# Patient Record
Sex: Male | Born: 1975 | Race: Black or African American | Hispanic: No | Marital: Single | Smoking: Current every day smoker
Health system: Southern US, Community
[De-identification: ages and names within clinical notes are randomized; demographics above are authoritative.]

---

## 1997-10-23 ENCOUNTER — Emergency Department (HOSPITAL_COMMUNITY): Admission: EM | Admit: 1997-10-23 | Discharge: 1997-10-23 | Payer: Self-pay | Admitting: *Deleted

## 2007-01-11 ENCOUNTER — Emergency Department (HOSPITAL_COMMUNITY): Admission: EM | Admit: 2007-01-11 | Discharge: 2007-01-11 | Payer: Self-pay | Admitting: Emergency Medicine

## 2008-07-16 ENCOUNTER — Emergency Department (HOSPITAL_COMMUNITY): Admission: EM | Admit: 2008-07-16 | Discharge: 2008-07-16 | Payer: Self-pay | Admitting: Emergency Medicine

## 2008-10-15 ENCOUNTER — Emergency Department (HOSPITAL_COMMUNITY): Admission: EM | Admit: 2008-10-15 | Discharge: 2008-10-15 | Payer: Self-pay | Admitting: Family Medicine

## 2009-08-12 ENCOUNTER — Emergency Department (HOSPITAL_COMMUNITY): Admission: EM | Admit: 2009-08-12 | Discharge: 2009-08-12 | Payer: Self-pay | Admitting: Family Medicine

## 2010-03-18 ENCOUNTER — Emergency Department (HOSPITAL_COMMUNITY)
Admission: EM | Admit: 2010-03-18 | Discharge: 2010-03-18 | Disposition: A | Discharge status: Home / Self Care | Payer: Self-pay | Attending: Emergency Medicine | Admitting: Emergency Medicine

## 2010-03-18 DIAGNOSIS — L03317 Cellulitis of buttock: Secondary | ICD-10-CM | POA: Insufficient documentation

## 2010-03-18 DIAGNOSIS — L0231 Cutaneous abscess of buttock: Secondary | ICD-10-CM | POA: Insufficient documentation

## 2010-04-24 LAB — CULTURE, ROUTINE-ABSCESS

## 2010-05-16 LAB — CULTURE, ROUTINE-ABSCESS

## 2010-10-05 ENCOUNTER — Emergency Department (HOSPITAL_COMMUNITY)
Admission: EM | Admit: 2010-10-05 | Discharge: 2010-10-06 | Disposition: A | Discharge status: Home / Self Care | Payer: Self-pay | Attending: Emergency Medicine | Admitting: Emergency Medicine

## 2010-10-05 ENCOUNTER — Emergency Department (HOSPITAL_COMMUNITY)
Admission: EM | Admit: 2010-10-05 | Discharge: 2010-10-05 | Discharge status: Against Medical Advice | Payer: Self-pay | Attending: Emergency Medicine | Admitting: Emergency Medicine

## 2010-10-05 DIAGNOSIS — IMO0001 Reserved for inherently not codable concepts without codable children: Secondary | ICD-10-CM | POA: Insufficient documentation

## 2010-10-05 DIAGNOSIS — L0501 Pilonidal cyst with abscess: Secondary | ICD-10-CM | POA: Insufficient documentation

## 2014-01-16 ENCOUNTER — Emergency Department (HOSPITAL_COMMUNITY)
Admission: EM | Admit: 2014-01-16 | Discharge: 2014-01-16 | Disposition: A | Discharge status: Home / Self Care | Payer: Self-pay | Attending: Emergency Medicine | Admitting: Emergency Medicine

## 2014-01-16 ENCOUNTER — Encounter (HOSPITAL_COMMUNITY): Payer: Self-pay | Admitting: Emergency Medicine

## 2014-01-16 DIAGNOSIS — R51 Headache: Secondary | ICD-10-CM | POA: Insufficient documentation

## 2014-01-16 DIAGNOSIS — Z720 Tobacco use: Secondary | ICD-10-CM | POA: Insufficient documentation

## 2014-01-16 DIAGNOSIS — I10 Essential (primary) hypertension: Secondary | ICD-10-CM | POA: Insufficient documentation

## 2014-01-16 LAB — CBC WITH DIFFERENTIAL/PLATELET
BASOS ABS: 0.1 10*3/uL (ref 0.0–0.1)
Basophils Relative: 1 % (ref 0–1)
Eosinophils Absolute: 0.1 10*3/uL (ref 0.0–0.7)
Eosinophils Relative: 2 % (ref 0–5)
HEMATOCRIT: 41.4 % (ref 39.0–52.0)
HEMOGLOBIN: 14.7 g/dL (ref 13.0–17.0)
LYMPHS ABS: 3.4 10*3/uL (ref 0.7–4.0)
LYMPHS PCT: 40 % (ref 12–46)
MCH: 31.3 pg (ref 26.0–34.0)
MCHC: 35.5 g/dL (ref 30.0–36.0)
MCV: 88.3 fL (ref 78.0–100.0)
MONO ABS: 0.7 10*3/uL (ref 0.1–1.0)
MONOS PCT: 8 % (ref 3–12)
NEUTROS ABS: 4.2 10*3/uL (ref 1.7–7.7)
Neutrophils Relative %: 49 % (ref 43–77)
Platelets: 221 10*3/uL (ref 150–400)
RBC: 4.69 MIL/uL (ref 4.22–5.81)
RDW: 12.8 % (ref 11.5–15.5)
WBC: 8.5 10*3/uL (ref 4.0–10.5)

## 2014-01-16 LAB — COMPREHENSIVE METABOLIC PANEL
ALT: 19 U/L (ref 0–53)
AST: 21 U/L (ref 0–37)
Albumin: 4.2 g/dL (ref 3.5–5.2)
Alkaline Phosphatase: 71 U/L (ref 39–117)
Anion gap: 15 (ref 5–15)
BILIRUBIN TOTAL: 0.6 mg/dL (ref 0.3–1.2)
BUN: 13 mg/dL (ref 6–23)
CHLORIDE: 101 meq/L (ref 96–112)
CO2: 24 meq/L (ref 19–32)
CREATININE: 1.14 mg/dL (ref 0.50–1.35)
Calcium: 9.8 mg/dL (ref 8.4–10.5)
GFR calc Af Amer: >90 mL/min (ref >90)
GFR, EST NON AFRICAN AMERICAN: 79 mL/min — AB (ref >90)
GLUCOSE: 95 mg/dL (ref 70–99)
Potassium: 4 mEq/L (ref 3.7–5.3)
Sodium: 140 mEq/L (ref 137–147)
Total Protein: 7.4 g/dL (ref 6.0–8.3)

## 2014-01-16 MED ORDER — IBUPROFEN 800 MG PO TABS: 800.0000 mg | ORAL_TABLET | Freq: Three times a day (TID) | ORAL | Status: DC

## 2014-01-16 MED ORDER — HYDROCHLOROTHIAZIDE 25 MG PO TABS: 25.0000 mg | ORAL_TABLET | Freq: Every day | ORAL | Status: DC

## 2014-01-16 NOTE — ED Notes (Signed)
Pt asking for something to eat.  PA made aware.

## 2014-01-16 NOTE — ED Notes (Signed)
Patient states he has had a headache that has been ongoing x 2 weeks.  Patient had BP checked last night by cousin and the BP was 168/92.

## 2014-01-16 NOTE — Discharge Instructions (Signed)
Hypertension °Hypertension, commonly called high blood pressure, is when the force of blood pumping through your arteries is too strong. Your arteries are the blood vessels that carry blood from your heart throughout your body. A blood pressure reading consists of a higher number over a lower number, such as 110/72. The higher number (systolic) is the pressure inside your arteries when your heart pumps. The lower number (diastolic) is the pressure inside your arteries when your heart relaxes. Ideally you want your blood pressure below 120/80. °Hypertension forces your heart to work harder to pump blood. Your arteries may become narrow or stiff. Having hypertension puts you at risk for heart disease, stroke, and other problems.  °RISK FACTORS °Some risk factors for high blood pressure are controllable. Others are not.  °Risk factors you cannot control include:  °· Race. You may be at higher risk if you are African American. °· Age. Risk increases with age. °· Gender. Men are at higher risk than women before age 45 years. After age 65, women are at higher risk than men. °Risk factors you can control include: °· Not getting enough exercise or physical activity. °· Being overweight. °· Getting too much fat, sugar, calories, or salt in your diet. °· Drinking too much alcohol. °SIGNS AND SYMPTOMS °Hypertension does not usually cause signs or symptoms. Extremely high blood pressure (hypertensive crisis) may cause headache, anxiety, shortness of breath, and nosebleed. °DIAGNOSIS  °To check if you have hypertension, your health care provider will measure your blood pressure while you are seated, with your arm held at the level of your heart. It should be measured at least twice using the same arm. Certain conditions can cause a difference in blood pressure between your right and left arms. A blood pressure reading that is higher than normal on one occasion does not mean that you need treatment. If one blood pressure reading  is high, ask your health care provider about having it checked again. °TREATMENT  °Treating high blood pressure includes making lifestyle changes and possibly taking medicine. Living a healthy lifestyle can help lower high blood pressure. You may need to change some of your habits. °Lifestyle changes may include: °· Following the DASH diet. This diet is high in fruits, vegetables, and whole grains. It is low in salt, red meat, and added sugars. °· Getting at least 2½ hours of brisk physical activity every week. °· Losing weight if necessary. °· Not smoking. °· Limiting alcoholic beverages. °· Learning ways to reduce stress. ° If lifestyle changes are not enough to get your blood pressure under control, your health care provider may prescribe medicine. You may need to take more than one. Work closely with your health care provider to understand the risks and benefits. °HOME CARE INSTRUCTIONS °· Have your blood pressure rechecked as directed by your health care provider.   °· Take medicines only as directed by your health care provider. Follow the directions carefully. Blood pressure medicines must be taken as prescribed. The medicine does not work as well when you skip doses. Skipping doses also puts you at risk for problems.   °· Do not smoke.   °· Monitor your blood pressure at home as directed by your health care provider.  °SEEK MEDICAL CARE IF:  °· You think you are having a reaction to medicines taken. °· You have recurrent headaches or feel dizzy. °· You have swelling in your ankles. °· You have trouble with your vision. °SEEK IMMEDIATE MEDICAL CARE IF: °· You develop a severe headache or confusion. °·   You have unusual weakness, numbness, or feel faint.  You have severe chest or abdominal pain.  You vomit repeatedly.  You have trouble breathing. MAKE SURE YOU:   Understand these instructions.  Will watch your condition.  Will get help right away if you are not doing well or get worse. Document  Released: 01/23/2005 Document Revised: 06/09/2013 Document Reviewed: 11/15/2012 [facility] Patient Information 2015 [place], [place]. This information is not intended to replace advice given to you by your health care provider. Make sure you discuss any questions you have with your health care provider.  Heart Disease Prevention Heart disease can lead to heart attacks and strokes. This is a leading cause of death. Heart disease can be inherited and can be caused from the lifestyle you lead. You can do a lot to keep your heart and blood vessels healthy.  WHAT SHOULD I DO EACH DAY TO KEEP MY HEART HEALTHY?  Do not smoke.  Follow a healthy eating plan as recommended by your caregiver or dietitian.  Be active for a total of 30 minutes most days. Ask your caregiver what activities are best for you.  Limit the amount of alcohol you drink.  Involve family and friends to help you with a healthy lifestyle. HOW DOES HEART DISEASE CAUSE HIGH BLOOD PRESSURE?  Narrowed blood vessels leave a smaller opening for blood to flow through. It is like turning on a garden hose and holding your thumb over the opening. The smaller opening makes the water shoot out with more pressure. In the same way, narrowed blood vessels can lead to high blood pressure. Other factors, such as kidney problems and being overweight, also can lead to high blood pressure.  If you have high blood pressure you may need to take blood pressure medicine every day. Some types of blood pressure medicine can also help keep your kidneys healthy.  Many people with diabetes also have high blood pressure. If you have heart, eye, or kidney problems from diabetes, high blood pressure can make them worse. HOW DO MY BLOOD VESSELS GET CLOGGED?  Cholesterol is a substance that is made by the body and used for many important functions. It is also found in food that comes from animals. When your cholesterol is high, it can stick to the insides of your blood  vessels, making them narrowed and even clogged. This problem is called atherosclerosis.  Narrowed and clogged blood vessels make it harder for blood to get to important body organs. This can cause problems such as:  Chest pain (angina). Angina can cause temporary pain in your chest, arms, shoulders, or back. You may feel the pain more when your heart beats faster, such as when you exercise. The pain may go away when you rest. You also may feel very weak and sweaty.  A heart attack. A heart attack happens when a blood vessel in or near the heart becomes blocked. Not enough blood is getting to the heart. During a heart attack, you may have chest pain in your chest, arms, shoulders, or back along with nausea, indigestion, extreme weakness, and sweating. WHAT CAN I DO TO PREVENT HEART DISEASE?   Keep your blood pressure under control as recommended by your caregiver.  Keep your cholesterol under control. Have it checked at least once a year. Target cholesterol levels for most people are:  Total blood cholesterol level: Below 200.  LDL (bad) cholesterol: Below 100.  HDL (good) cholesterol: Above 40 in men and above 50 in women.  Triglycerides (  another type of fat in the blood): Below 150.  Make physical activity a part of your daily routine. Check with your caregiver to learn what activities are best for you.  Make sure that the foods you eat are "heart-healthy."  Include foods high in fiber, such as oat bran, oatmeal, whole-grain breads and cereals.  Cut back on fried foods and foods high in saturated fat. This includes foods such as meats, butter, whole dairy products, shortening, and coconut or palm oil.  Avoid salty foods such as canned food, luncheon meat, salty snacks, and fast food.  Eat more fruits and vegetables.  Drink less alcohol.  Lose weight as recommended by your caregiver.  If you smoke, quit. Your caregiver can help you with quitting options.  Ask your caregiver  whether you should take a daily aspirin. Studies have shown that taking aspirin can help reduce your risk of heart disease and stroke.  Take your prescribed medicines as directed. WHAT ARE THE WARNING SIGNS OF A HEART ATTACK? You may have one or more of the following warning signs:  Chest pain or discomfort.  Pain or discomfort in your arms, back, jaw, or neck.  Indigestion or stomach pain.  Shortness of breath.  Sweating.  Nausea or vomiting.  Lightheadedness.  No warning signs at all or they may come and go. FOR MORE INFORMATION  To find out more about heart disease and stroke prevention, visit the American Heart Association website at www.americanheart.org Document Released: 09/07/2003 Document Revised: 07/25/2011 Document Reviewed: 03/19/2013 [facility] Patient Information 2015 [place], [place]. This information is not intended to replace advice given to you by your health care provider. Make sure you discuss any questions you have with your health care provider.  DASH Eating Plan DASH stands for "Dietary Approaches to Stop Hypertension." The DASH eating plan is a healthy eating plan that has been shown to reduce high blood pressure (hypertension). Additional health benefits may include reducing the risk of type 2 diabetes mellitus, heart disease, and stroke. The DASH eating plan may also help with weight loss. WHAT DO I NEED TO KNOW ABOUT THE DASH EATING PLAN? For the DASH eating plan, you will follow these general guidelines:  Choose foods with a percent daily value for sodium of less than 5% (as listed on the food label).  Use salt-free seasonings or herbs instead of table salt or sea salt.  Check with your health care provider or pharmacist before using salt substitutes.  Eat lower-sodium products, often labeled as "lower sodium" or "no salt added."  Eat fresh foods.  Eat more vegetables, fruits, and low-fat dairy products.  Choose whole grains. Look for the word  "whole" as the first word in the ingredient list.  Choose fish and skinless chicken or [place] more often than red meat. Limit fish, poultry, and meat to 6 oz (170 g) each day.  Limit sweets, desserts, sugars, and sugary drinks.  Choose heart-healthy fats.  Limit cheese to 1 oz (28 g) per day.  Eat more home-cooked food and less restaurant, buffet, and fast food.  Limit fried foods.  Cook foods using methods other than frying.  Limit canned vegetables. If you do use them, rinse them well to decrease the sodium.  When eating at a restaurant, ask that your food be prepared with less salt, or no salt if possible. WHAT FOODS CAN I EAT? Seek help from a dietitian for individual calorie needs. Grains Whole grain or whole wheat bread. Brown rice. Whole grain or whole  wheat pasta. Quinoa, bulgur, and whole grain cereals. Low-sodium cereals. Corn or whole wheat flour tortillas. Whole grain cornbread. Whole grain crackers. Low-sodium crackers. Vegetables Fresh or frozen vegetables (raw, steamed, roasted, or grilled). Low-sodium or reduced-sodium tomato and vegetable juices. Low-sodium or reduced-sodium tomato sauce and paste. Low-sodium or reduced-sodium canned vegetables.  Fruits All fresh, canned (in natural juice), or frozen fruits. Meat and Other Protein Products Ground beef (85% or leaner), grass-fed beef, or beef trimmed of fat. Skinless chicken or [place]. Ground chicken or [place]. Pork trimmed of fat. All fish and seafood. Eggs. Dried beans, peas, or lentils. Unsalted nuts and seeds. Unsalted canned beans. Dairy Low-fat dairy products, such as skim or 1% milk, 2% or reduced-fat cheeses, low-fat ricotta or cottage cheese, or plain low-fat yogurt. Low-sodium or reduced-sodium cheeses. Fats and Oils Tub margarines without trans fats. Light or reduced-fat mayonnaise and salad dressings (reduced sodium). Avocado. Safflower, olive, or canola oils. Natural peanut or almond  butter. Other Unsalted popcorn and pretzels. The items listed above may not be a complete list of recommended foods or beverages. Contact your dietitian for more options. WHAT FOODS ARE NOT RECOMMENDED? Grains White bread. White pasta. White rice. Refined cornbread. Bagels and croissants. Crackers that contain trans fat. Vegetables Creamed or fried vegetables. Vegetables in a cheese sauce. Regular canned vegetables. Regular canned tomato sauce and paste. Regular tomato and vegetable juices. Fruits Dried fruits. Canned fruit in light or heavy syrup. Fruit juice. Meat and Other Protein Products Fatty cuts of meat. Ribs, chicken wings, bacon, sausage, bologna, salami, chitterlings, fatback, hot dogs, bratwurst, and packaged luncheon meats. Salted nuts and seeds. Canned beans with salt. Dairy Whole or 2% milk, cream, half-and-half, and cream cheese. Whole-fat or sweetened yogurt. Full-fat cheeses or blue cheese. Nondairy creamers and whipped toppings. Processed cheese, cheese spreads, or cheese curds. Condiments Onion and garlic salt, seasoned salt, table salt, and sea salt. Canned and packaged gravies. Worcestershire sauce. Tartar sauce. Barbecue sauce. Teriyaki sauce. Soy sauce, including reduced sodium. Steak sauce. Fish sauce. Oyster sauce. Cocktail sauce. Horseradish. Ketchup and mustard. Meat flavorings and tenderizers. Bouillon cubes. Hot sauce. Tabasco sauce. Marinades. Taco seasonings. Relishes. Fats and Oils Butter, stick margarine, lard, shortening, ghee, and bacon fat. Coconut, palm kernel, or palm oils. Regular salad dressings. Other Pickles and olives. Salted popcorn and pretzels. The items listed above may not be a complete list of foods and beverages to avoid. Contact your dietitian for more information. WHERE CAN I FIND MORE INFORMATION? National Heart, Lung, and Blood Institute: [employer].it Document Released: 01/12/2011 Document Revised:  06/09/2013 Document Reviewed: 11/27/2012 [facility] Patient Information 2015 [place], [place]. This information is not intended to replace advice given to you by your health care provider. Make sure you discuss any questions you have with your health care provider. General Headache Without Cause A general headache is pain or discomfort felt around the head or neck area. The cause may not be found.  HOME CARE   Keep all doctor visits.  Only take medicines as told by your doctor.  Lie down in a dark, quiet room when you have a headache.  Keep a journal to find out if certain things bring on headaches. For example, write down:  What you eat and drink.  How much sleep you get.  Any change to your diet or medicines.  Relax by getting a massage or doing other relaxing activities.  Put ice or heat packs on the head and neck area as told by your doctor.  Lessen  stress.  Sit up straight. Do not tighten (tense) your muscles.  Quit smoking if you smoke.  Lessen how much alcohol you drink.  Lessen how much caffeine you drink, or stop drinking caffeine.  Eat and sleep on a regular schedule.  Get 7 to 9 hours of sleep, or as told by your doctor.  Keep lights dim if bright lights bother you or make your headaches worse. GET HELP RIGHT AWAY IF:   Your headache becomes really bad.  You have a fever.  You have a stiff neck.  You have trouble seeing.  Your muscles are weak, or you lose muscle control.  You lose your balance or have trouble walking.  You feel like you will pass out (faint), or you pass out.  You have really bad symptoms that are different than your first symptoms.  You have problems with the medicines given to you by your doctor.  Your medicines do not work.  Your headache feels different than the other headaches.  You feel sick to your stomach (nauseous) or throw up (vomit). MAKE SURE YOU:   Understand these instructions.  Will watch your  condition.  Will get help right away if you are not doing well or get worse. Document Released: 11/02/2007 Document Revised: 04/17/2011 Document Reviewed: 01/13/2011 [facility] Patient Information 2015 [place], [place]. This information is not intended to replace advice given to you by your health care provider. Make sure you discuss any questions you have with your health care provider.

## 2014-01-16 NOTE — ED Provider Notes (Signed)
CSN: [id number]     Arrival date & time 01/16/14  0802 History   First MD Initiated Contact with Patient 01/16/14 0815     Chief Complaint  Patient presents with  . Hypertension  . Headache     (Consider location/radiation/quality/duration/timing/severity/associated sxs/prior Treatment) Patient is a 38 y.o. male presenting with hypertension and headaches. The history is provided by the patient. No language interpreter was used.  Hypertension This is a new problem. The current episode started 1 to 4 weeks ago. The problem occurs constantly. The problem has been unchanged. Associated symptoms include headaches and a rash. Nothing aggravates the symptoms. He has tried nothing for the symptoms. The treatment provided no relief.  Headache Pt reports he has had a headache for the past 2 weeks.  Pt reports he had a friend take his blood pressure last pm and it was elevated.  Pt reports increased stress.  Pt does not currently have a headache  History reviewed. No pertinent past medical history. History reviewed. No pertinent past surgical history. No family history on file. History  Substance Use Topics  . Smoking status: Current Every Day Smoker  . Smokeless tobacco: Not on file  . Alcohol Use: Yes     Comment: Daily, 2 glasses of brandy    Review of Systems  Skin: Positive for rash.  Neurological: Positive for headaches.  All other systems reviewed and are negative.     Allergies  Bee venom  Home Medications   Prior to Admission medications   Not on File   BP 166/94 mmHg  Pulse 75  Temp(Src) 98.1 F (36.7 C) (Oral)  Resp 12  SpO2 99% Physical Exam  Constitutional: He appears well-developed and well-nourished.  HENT:  Head: Normocephalic.  Right Ear: External ear normal.  Left Ear: External ear normal.  Nose: Nose normal.  Mouth/Throat: Oropharynx is clear and moist.  Eyes: Conjunctivae are normal. Pupils are equal, round, and reactive to light.  Neck: Normal  range of motion. Neck supple.  Cardiovascular: Normal rate and normal heart sounds.   Pulmonary/Chest: Effort normal.  Abdominal: Soft.  Musculoskeletal: Normal range of motion.  Neurological: He is alert.  Skin: Skin is warm.  Psychiatric: He has a normal mood and affect.  Nursing note and vitals reviewed.   ED Course  Procedures (including critical care time) Labs Review Labs Reviewed  CBC WITH DIFFERENTIAL  COMPREHENSIVE METABOLIC PANEL   Results for orders placed or performed during the hospital encounter of 01/16/14  CBC with Differential  Result Value Ref Range   WBC 8.5 4.0 - 10.5 K/uL   RBC 4.69 4.22 - 5.81 MIL/uL   Hemoglobin 14.7 13.0 - 17.0 g/dL   HCT 08.641.4 57.839.0 - 46.952.0 %   MCV 88.3 78.0 - 100.0 fL   MCH 31.3 26.0 - 34.0 pg   MCHC 35.5 30.0 - 36.0 g/dL   RDW 62.912.8 52.811.5 - 41.315.5 %   Platelets 221 150 - 400 K/uL   Neutrophils Relative % 49 43 - 77 %   Neutro Abs 4.2 1.7 - 7.7 K/uL   Lymphocytes Relative 40 12 - 46 %   Lymphs Abs 3.4 0.7 - 4.0 K/uL   Monocytes Relative 8 3 - 12 %   Monocytes Absolute 0.7 0.1 - 1.0 K/uL   Eosinophils Relative 2 0 - 5 %   Eosinophils Absolute 0.1 0.0 - 0.7 K/uL   Basophils Relative 1 0 - 1 %   Basophils Absolute 0.1 0.0 - 0.1 K/uL  Comprehensive metabolic panel  Result Value Ref Range   Sodium 140 137 - 147 mEq/L   Potassium 4.0 3.7 - 5.3 mEq/L   Chloride 101 96 - 112 mEq/L   CO2 24 19 - 32 mEq/L   Glucose, Bld 95 70 - 99 mg/dL   BUN 13 6 - 23 mg/dL   Creatinine, Ser 1.611.14 0.50 - 1.35 mg/dL   Calcium 9.8 8.4 - 09.610.5 mg/dL   Total Protein 7.4 6.0 - 8.3 g/dL   Albumin 4.2 3.5 - 5.2 g/dL   AST 21 0 - 37 U/L   ALT 19 0 - 53 U/L   Alkaline Phosphatase 71 39 - 117 U/L   Total Bilirubin 0.6 0.3 - 1.2 mg/dL   GFR calc non Af Amer 79 (L) >90 mL/min   GFR calc Af Amer >90 >90 mL/min   Anion gap 15 5 - 15   No results found.  Imaging Review No results found.   EKG Interpretation None      MDM   Final diagnoses:  Essential  hypertension    Pt does not currently have a headache I will treat with ibuprofen and hctz.   Pt given multiple referrals   Elson AreasLeslie K Makenize Messman, PA-C 01/16/14 0957  Elson AreasLeslie K Khalidah Herbold, PA-C 01/16/14 [id number]  Audree CamelScott T Goldston, MD 01/16/14 [phone]

## 2014-08-18 ENCOUNTER — Emergency Department (INDEPENDENT_AMBULATORY_CARE_PROVIDER_SITE_OTHER)
Admission: EM | Admit: 2014-08-18 | Discharge: 2014-08-18 | Disposition: A | Discharge status: Home / Self Care | Payer: Self-pay | Source: Home / Self Care | Attending: Emergency Medicine | Admitting: Emergency Medicine

## 2014-08-18 ENCOUNTER — Encounter (HOSPITAL_COMMUNITY): Payer: Self-pay | Admitting: Emergency Medicine

## 2014-08-18 DIAGNOSIS — K047 Periapical abscess without sinus: Secondary | ICD-10-CM

## 2014-08-18 MED ORDER — HYDROCODONE-ACETAMINOPHEN 5-325 MG PO TABS: 1.0000 | ORAL_TABLET | Freq: Four times a day (QID) | ORAL | Status: DC | PRN

## 2014-08-18 MED ORDER — IBUPROFEN 800 MG PO TABS: 800.0000 mg | ORAL_TABLET | Freq: Three times a day (TID) | ORAL | Status: DC | PRN

## 2014-08-18 MED ORDER — AMOXICILLIN 500 MG PO CAPS: 500.0000 mg | ORAL_CAPSULE | Freq: Two times a day (BID) | ORAL | Status: DC

## 2014-08-18 NOTE — ED Provider Notes (Signed)
CSN: [id number]     Arrival date & time 08/18/14  1957 History   First MD Initiated Contact with Patient 08/18/14 2004     Chief Complaint  Patient presents with  . Dental Pain   (Consider location/radiation/quality/duration/timing/severity/associated sxs/prior Treatment) HPI He is a [age] year old man here for evaluation of dental pain. This started last night. He reports his left lower molar is aching. He reports swelling of his jaw as well. No fevers or chills. He does not have a dentist yet.  History reviewed. No pertinent past medical history. History reviewed. No pertinent past surgical history. History reviewed. No pertinent family history. History  Substance Use Topics  . Smoking status: Current Every Day Smoker  . Smokeless tobacco: Not on file  . Alcohol Use: Yes     Comment: Daily, 2 glasses of brandy    Review of Systems As in history of present illness Allergies  Bee venom  Home Medications   Prior to Admission medications   Medication Sig Start Date End Date Taking? Authorizing Provider  amoxicillin (AMOXIL) 500 MG capsule Take 1 capsule (500 mg total) by mouth 2 (two) times daily. 08/18/14   Charm RingsErin J Bernardine Langworthy, MD  hydrochlorothiazide (HYDRODIURIL) 25 MG tablet Take 1 tablet (25 mg total) by mouth daily. 01/16/14   Elson AreasLeslie K Sofia, PA-C  HYDROcodone-acetaminophen (NORCO) 5-325 MG per tablet Take 1 tablet by mouth every 6 (six) hours as needed for moderate pain. 08/18/14   Charm RingsErin J Karnisha Lefebre, MD  ibuprofen (ADVIL,MOTRIN) 800 MG tablet Take 1 tablet (800 mg total) by mouth every 8 (eight) hours as needed. 08/18/14   Charm RingsErin J Jhace Fennell, MD  OVER THE COUNTER MEDICATION Take 2 tablets by mouth 2 (two) times daily. Testosterone booster    Historical Provider, MD   BP 132/82 mmHg  Pulse 80  Temp(Src) 98.7 F (37.1 C) (Oral)  Resp 16  SpO2 95% Physical Exam  Constitutional: He is oriented to person, place, and time. He appears well-developed and well-nourished. No distress.  HENT:    Mouth/Throat:    Cardiovascular: Normal rate.   Pulmonary/Chest: Effort normal.  Neurological: He is alert and oriented to person, place, and time.    ED Course  Procedures (including critical care time) Labs Review Labs Reviewed - No data to display  Imaging Review No results found.   MDM   1. Dental infection    Amoxicillin, ibuprofen, Norco. Follow up with a dentist as soon as possible.    Charm RingsErin J Kayton Ripp, MD 08/18/14 2015

## 2014-08-18 NOTE — Discharge Instructions (Signed)
That tooth likely needs to come out. Take amoxicillin twice a day for the next 10 days. Use the ibuprofen and Norco as needed for pain. Please follow-up with a dentist as soon as possible.   Abscessed Tooth An abscessed tooth is an infection around your tooth. It may be caused by holes or damage to the tooth (cavity) or a dental disease. An abscessed tooth causes mild to very bad pain in and around the tooth. See your dentist right away if you have tooth or gum pain. HOME CARE  Take your medicine as told. Finish it even if you start to feel better.  Do not drive after taking pain medicine.  Rinse your mouth (gargle) often with salt water ( teaspoon salt in 8 ounces of warm water).  Do not apply heat to the outside of your face. GET HELP RIGHT AWAY IF:   You have a temperature by mouth above 102 F (38.9 C), not controlled by medicine.  You have chills and a very bad headache.  You have problems breathing or swallowing.  Your mouth will not open.  You develop puffiness (swelling) on the neck or around the eye.  Your pain is not helped by medicine.  Your pain is getting worse instead of better. MAKE SURE YOU:   Understand these instructions.  Will watch your condition.  Will get help right away if you are not doing well or get worse. Document Released: 07/12/2007 Document Revised: 04/17/2011 Document Reviewed: 05/03/2010 [facility] Patient Information 2015 [employer], [place]. This information is not intended to replace advice given to you by your health care provider. Make sure you discuss any questions you have with your health care provider.

## 2014-08-18 NOTE — ED Notes (Signed)
C/o dental pain States left bottom tooth hurts No dentist appt made States insurance just started Oxycodone was used as tx

## 2014-08-21 ENCOUNTER — Emergency Department (HOSPITAL_COMMUNITY)
Admission: EM | Admit: 2014-08-21 | Discharge: 2014-08-21 | Disposition: A | Discharge status: Home / Self Care | Payer: Self-pay | Attending: Emergency Medicine | Admitting: Emergency Medicine

## 2014-08-21 ENCOUNTER — Encounter (HOSPITAL_COMMUNITY): Payer: Self-pay | Admitting: Emergency Medicine

## 2014-08-21 DIAGNOSIS — K0889 Other specified disorders of teeth and supporting structures: Secondary | ICD-10-CM

## 2014-08-21 DIAGNOSIS — Z720 Tobacco use: Secondary | ICD-10-CM | POA: Insufficient documentation

## 2014-08-21 DIAGNOSIS — K088 Other specified disorders of teeth and supporting structures: Secondary | ICD-10-CM | POA: Insufficient documentation

## 2014-08-21 DIAGNOSIS — Z792 Long term (current) use of antibiotics: Secondary | ICD-10-CM | POA: Insufficient documentation

## 2014-08-21 DIAGNOSIS — Z79899 Other long term (current) drug therapy: Secondary | ICD-10-CM | POA: Insufficient documentation

## 2014-08-21 DIAGNOSIS — G479 Sleep disorder, unspecified: Secondary | ICD-10-CM | POA: Insufficient documentation

## 2014-08-21 MED ORDER — CLINDAMYCIN HCL 150 MG PO CAPS: 300.0000 mg | ORAL_CAPSULE | Freq: Three times a day (TID) | ORAL | Status: DC

## 2014-08-21 NOTE — ED Notes (Signed)
Patient states went to urgent care x 2 days ago for dental pain and was given amoxicillin for infection.  Patient states since that time, he has had an abscess form and his L lower jaw has abscess at this time.   Denies other symptoms.

## 2014-08-21 NOTE — ED Provider Notes (Signed)
CSN: [id number]     Arrival date & time 08/21/14  1241 History  This chart was scribed for Cameron Horsemanobert [name], PA-C, working with Purvis SheffieldForrest Harrison, MD by Elon SpannerGarrett Cook, ED Scribe. This patient was seen in room TR04C/TR04C and the patient's care was started at 1:15 PM.   Chief Complaint  Patient presents with  . Dental Pain   The history is provided by the patient. No language interpreter was used.   HPI Comments: Cameron Carrillo is a 39 y.o. male who presents to the Emergency Department complaining of left lower dental pain onste 5 days ago with associated mild left-sided swelling onset two days ago.  The patient describes the pain as being located on the external face vs. Within the mouth.  He was seen the day after onset at urgent care and diagnosed with an abscess.  He was prescribed amoxicillin which he has taken with slight worsening of the complaint.  He was not scheduled for f/u with a dentist at that time.      History reviewed. No pertinent past medical history. History reviewed. No pertinent past surgical history. No family history on file. History  Substance Use Topics  . Smoking status: Current Every Day Smoker  . Smokeless tobacco: Not on file  . Alcohol Use: Yes     Comment: Daily, 2 glasses of brandy    Review of Systems  Constitutional: Negative for fever and chills.  HENT: Positive for dental problem and facial swelling. Negative for drooling.   Neurological: Negative for speech difficulty.  Psychiatric/Behavioral: Positive for sleep disturbance.      Allergies  Bee venom  Home Medications   Prior to Admission medications   Medication Sig Start Date End Date Taking? Authorizing Provider  amoxicillin (AMOXIL) 500 MG capsule Take 1 capsule (500 mg total) by mouth 2 (two) times daily. 08/18/14   Charm RingsErin J Honig, MD  hydrochlorothiazide (HYDRODIURIL) 25 MG tablet Take 1 tablet (25 mg total) by mouth daily. 01/16/14   Elson AreasLeslie K Sofia, PA-C  HYDROcodone-acetaminophen  (NORCO) 5-325 MG per tablet Take 1 tablet by mouth every 6 (six) hours as needed for moderate pain. 08/18/14   Charm RingsErin J Honig, MD  ibuprofen (ADVIL,MOTRIN) 800 MG tablet Take 1 tablet (800 mg total) by mouth every 8 (eight) hours as needed. 08/18/14   Charm RingsErin J Honig, MD  OVER THE COUNTER MEDICATION Take 2 tablets by mouth 2 (two) times daily. Testosterone booster    Historical Provider, MD   BP 149/88 mmHg  Pulse 73  Temp(Src) 98.8 F (37.1 C) (Oral)  Resp 16  Ht 6\' 1"  (1.854 m)  Wt 221 lb 11.2 oz (100.562 kg)  BMI 29.26 kg/m2  SpO2 96% Physical Exam  Constitutional: He is oriented to person, place, and time. He appears well-developed and well-nourished. No distress.  HENT:  Head: Normocephalic and atraumatic.  Mouth/Throat:    Poor dentition throughout.  Affected tooth as diagrammed.  No signs of peritonsillar or tonsillar abscess.  No signs of gingival abscess. Oropharynx is clear and without exudates.  Uvula is midline.  Airway is intact. No signs of Ludwig's angina with palpation of oral and sublingual mucosa.   Eyes: Conjunctivae and EOM are normal.  Neck: Normal range of motion. Neck supple. No tracheal deviation present.  Cardiovascular: Normal rate.   Pulmonary/Chest: Effort normal. No respiratory distress.  Abdominal: He exhibits no distension.  Musculoskeletal: Normal range of motion.  Neurological: He is alert and oriented to person, place, and time.  Skin:  Skin is warm and dry.  Psychiatric: He has a normal mood and affect. His behavior is normal. Judgment and thought content normal.  Nursing note and vitals reviewed.   ED Course  Procedures (including critical care time)  DIAGNOSTIC STUDIES: Oxygen Saturation is 96% on RA, normal by my interpretation.    COORDINATION OF CARE:  1:20 PM Will change antibiotic from clindamycin to amoxicillin and refer to dentist.  Patient advised of return precautions.  Patient acknowledges and agrees with plan.    Labs Review Labs  Reviewed - No data to display  Imaging Review No results found.   EKG Interpretation None      MDM   Final diagnoses:  Pain, dental   Patient with toothache.  No gross abscess.  Exam unconcerning for Ludwig's angina or spread of infection.  Will treat with penicillin and OTC pain medicine.  Urged patient to follow-up with dentist.     I personally performed the services described in this documentation, which was scribed in my presence. The recorded information has been reviewed and is accurate.      Cameron Horseman, PA-C 08/21/14 1333  Purvis Sheffield, MD 08/22/14 [phone]

## 2014-08-21 NOTE — Discharge Instructions (Signed)

## 2015-10-14 ENCOUNTER — Ambulatory Visit (HOSPITAL_COMMUNITY)
Admission: EM | Admit: 2015-10-14 | Discharge: 2015-10-14 | Disposition: A | Discharge status: Home / Self Care | Payer: Self-pay | Attending: Internal Medicine | Admitting: Internal Medicine

## 2015-10-14 ENCOUNTER — Ambulatory Visit (INDEPENDENT_AMBULATORY_CARE_PROVIDER_SITE_OTHER): Payer: Self-pay

## 2015-10-14 ENCOUNTER — Encounter (HOSPITAL_COMMUNITY): Payer: Self-pay | Admitting: Emergency Medicine

## 2015-10-14 DIAGNOSIS — S82892A Other fracture of left lower leg, initial encounter for closed fracture: Secondary | ICD-10-CM

## 2015-10-14 MED ORDER — OXYCODONE-ACETAMINOPHEN 10-325 MG PO TABS: 1.0000 | ORAL_TABLET | ORAL | 0 refills | Status: DC | PRN

## 2015-10-14 NOTE — Discharge Instructions (Signed)
KEEP ELEVATED  APPLY COLD COMPRESSES TO ANKLE  ARRANGE FOLLOW UP WITH ORTHO PROVIDER

## 2015-10-14 NOTE — ED Notes (Signed)
Ortho tech has been notified 

## 2015-10-14 NOTE — ED Provider Notes (Signed)
CSN: [id number]     Arrival date & time 10/14/15  1407 History   First MD Initiated Contact with Patient 10/14/15 1505     No chief complaint on file.  (Consider location/radiation/quality/duration/timing/severity/associated sxs/prior Treatment) HPI History obtained from patient:  Pt presents with the cc of:  Left ankle injury Duration of symptoms: Since yesterday Treatment prior to arrival: Elevation and cold compresses and Tylenol Context: Was working on an embankment and stepped in a hole twisting his ankle and causing him to fall. Using crutches to get around. Other symptoms include: Painful walking Pain score: 5 or 6 FAMILY HISTORY: Hypertension    No past medical history on file. No past surgical history on file. No family history on file. Social History  Substance Use Topics  . Smoking status: Current Every Day Smoker  . Smokeless tobacco: Not on file  . Alcohol use Yes     Comment: Daily, 2 glasses of brandy    Review of Systems  Denies: HEADACHE, NAUSEA, ABDOMINAL PAIN, CHEST PAIN, CONGESTION, DYSURIA, SHORTNESS OF BREATH  Allergies  Bee venom  Home Medications   Prior to Admission medications   Medication Sig Start Date End Date Taking? Authorizing Provider  clindamycin (CLEOCIN) 150 MG capsule Take 2 capsules (300 mg total) by mouth 3 (three) times daily. May dispense as 150mg  capsules 08/21/14   Roxy Horseman, PA-C  hydrochlorothiazide (HYDRODIURIL) 25 MG tablet Take 1 tablet (25 mg total) by mouth daily. 01/16/14   Elson Areas, PA-C  HYDROcodone-acetaminophen (NORCO) 5-325 MG per tablet Take 1 tablet by mouth every 6 (six) hours as needed for moderate pain. 08/18/14   Charm Rings, MD  ibuprofen (ADVIL,MOTRIN) 800 MG tablet Take 1 tablet (800 mg total) by mouth every 8 (eight) hours as needed. 08/18/14   Charm Rings, MD  OVER THE COUNTER MEDICATION Take 2 tablets by mouth 2 (two) times daily. Testosterone booster    Historical Provider, MD   Meds Ordered  and Administered this Visit  Medications - No data to display  BP 139/83 (BP Location: Right Arm)   Pulse 79   Temp 98.4 F (36.9 C) (Oral)   Resp 18   SpO2 98%  No data found.   Physical Exam NURSES NOTES AND VITAL SIGNS REVIEWED. CONSTITUTIONAL: Well developed, well nourished, no acute distress HEENT: normocephalic, atraumatic EYES: Conjunctiva normal NECK:normal ROM, supple, no adenopathy PULMONARY:No respiratory distress, normal effort ABDOMINAL: Soft, ND, NT BS+, No CVAT MUSCULOSKELETAL: Normal ROM of all extremities, Left ankle: Swollen tender to palpation distal fibula. No tibia tenderness. No proximal fibula tenderness. Achilles tendon is without tenderness. Sensory motor function are intact distally. SKIN: warm and dry without rash PSYCHIATRIC: Mood and affect, behavior are normal  Urgent Care Course   Clinical Course    Procedures (including critical care time)  Labs Review Labs Reviewed - No data to display  Imaging Review No results found.   Visual Acuity Review  Right Eye Distance:   Left Eye Distance:   Bilateral Distance:    Right Eye Near:   Left Eye Near:    Bilateral Near:         MDM   1. Closed left ankle fracture, initial encounter     Patient is reassured that there are no issues that require transfer to higher level of care at this time or additional tests. Patient is advised to continue home symptomatic treatment. Patient is advised that if there are new or worsening symptoms to attend the emergency  department, contact primary care provider, or return to UC. Instructions of care provided discharged home in stable condition.    THIS NOTE WAS GENERATED USING A VOICE RECOGNITION SOFTWARE PROGRAM. ALL REASONABLE EFFORTS  WERE MADE TO PROOFREAD THIS DOCUMENT FOR ACCURACY.  I have verbally reviewed the discharge instructions with the patient. A printed AVS was given to the patient.  All questions were answered prior to discharge.       Tharon AquasFrank C Jasline Buskirk, PA 10/14/15 [phone]

## 2015-10-14 NOTE — ED Triage Notes (Signed)
Pt reports he twisted his left ankle at work on Tuesday  Sx include: swelling and pain when bearing wt...  Brought back in wheelchair  A&O x4... NAD

## 2016-04-30 ENCOUNTER — Encounter (HOSPITAL_COMMUNITY): Payer: Self-pay | Admitting: Emergency Medicine

## 2016-04-30 ENCOUNTER — Emergency Department (HOSPITAL_COMMUNITY)
Admission: EM | Admit: 2016-04-30 | Discharge: 2016-04-30 | Disposition: A | Discharge status: Home / Self Care | Payer: Self-pay | Attending: Emergency Medicine | Admitting: Emergency Medicine

## 2016-04-30 DIAGNOSIS — K047 Periapical abscess without sinus: Secondary | ICD-10-CM | POA: Insufficient documentation

## 2016-04-30 DIAGNOSIS — F17200 Nicotine dependence, unspecified, uncomplicated: Secondary | ICD-10-CM | POA: Insufficient documentation

## 2016-04-30 DIAGNOSIS — Z79899 Other long term (current) drug therapy: Secondary | ICD-10-CM | POA: Insufficient documentation

## 2016-04-30 MED ORDER — LIDOCAINE HCL (PF) 1 % IJ SOLN
5.0000 mL | Freq: Once | INTRAMUSCULAR | Status: DC
  Filled 2016-04-30: qty 30

## 2016-04-30 MED ORDER — PENICILLIN V POTASSIUM 500 MG PO TABS: 500.0000 mg | ORAL_TABLET | Freq: Four times a day (QID) | ORAL | 0 refills | Status: AC

## 2016-04-30 MED ORDER — BUPIVACAINE-EPINEPHRINE (PF) 0.5% -1:200000 IJ SOLN
1.8000 mL | Freq: Once | INTRAMUSCULAR | Status: AC
  Administered 2016-04-30: 1.8 mL
  Filled 2016-04-30: qty 1.8

## 2016-04-30 NOTE — ED Triage Notes (Signed)
Patient c/o left lower dental pain x1 day. Ambulatory to triage.

## 2016-04-30 NOTE — ED Provider Notes (Signed)
[facility]-EMERGENCY DEPT Provider Note   CSN: [id number] Arrival date & time: 04/30/16  1427    By signing my name below, I, Cameron Carrillo, attest that this documentation has been prepared under the direction and in the presence of Cameron Carrillo, [place]-C. Electronically Signed: Valentino SaxonBianca Carrillo, ED Scribe. 04/30/16. 3:22 PM.  History   Chief Complaint Chief Complaint  Patient presents with  . Dental Pain   The history is provided by the patient. No language interpreter was used.   HPI Comments: Cameron BuntingRobert C Carrillo is a 41 y.o. male who presents to the Emergency Department complaining of 10/10, gradual onset, constant, left lower dental pain that occurred two days ago. He denies recent trauma or injury to the affected area. He describes his pain as an aching and pressure-like sensation. Pt reports associated left-sided neck pain. Pt states he has a h/o of similar symptoms and notes he was dx with an abscess and discharged with antibitoics. He notes his left, lower tooth is "split". He reports symptoms are similar to those associated with abscess. Pt notes he took some left-over prescribed amoxicillin with no minimal relief. He also reports taking left-over hydrocodone with no relief. He states his pain worsened with chewing and direct pressure. Pt notes he is able to tolerate fluids with no difficulty. He denies fever and chills.   History reviewed. No pertinent past medical history.  There are no active problems to display for this patient.   History reviewed. No pertinent surgical history.     Home Medications    Prior to Admission medications   Medication Sig Start Date End Date Taking? Authorizing Provider  clindamycin (CLEOCIN) 150 MG capsule Take 2 capsules (300 mg total) by mouth 3 (three) times daily. May dispense as 150mg  capsules 08/21/14   Cameron Horsemanobert Browning, PA-C  hydrochlorothiazide (HYDRODIURIL) 25 MG tablet Take 1 tablet (25 mg total) by mouth daily. 01/16/14   Cameron AreasLeslie K  Sofia, PA-C  HYDROcodone-acetaminophen (NORCO) 5-325 MG per tablet Take 1 tablet by mouth every 6 (six) hours as needed for moderate pain. 08/18/14   Cameron RingsErin J Honig, MD  ibuprofen (ADVIL,MOTRIN) 800 MG tablet Take 1 tablet (800 mg total) by mouth every 8 (eight) hours as needed. 08/18/14   Cameron RingsErin J Honig, MD  OVER THE COUNTER MEDICATION Take 2 tablets by mouth 2 (two) times daily. Testosterone booster    Historical Provider, MD  oxyCODONE-acetaminophen (PERCOCET) 10-325 MG tablet Take 1 tablet by mouth every 4 (four) hours as needed for pain. 10/14/15   Cameron AquasFrank C Patrick, PA  penicillin v potassium (VEETID) 500 MG tablet Take 1 tablet (500 mg total) by mouth 4 (four) times daily. 04/30/16 05/07/16  Cameron Carrillo, [place]    Family History History reviewed. No pertinent family history.  Social History Social History  Substance Use Topics  . Smoking status: Current Every Day Smoker  . Smokeless tobacco: Never Used  . Alcohol use Yes     Comment: Daily, 2 glasses of brandy     Allergies   Bee venom   Review of Systems Review of Systems  Constitutional: Negative for chills and fever.  HENT: Positive for dental problem (left-sided).   Musculoskeletal: Positive for neck pain.     Physical Exam Updated Vital Signs BP (!) 156/99 (BP Location: Right Arm)   Pulse 70   Temp 98.9 F (37.2 C) (Oral)   Resp 18   SpO2 100%   Physical Exam  Constitutional: He appears well-developed and well-nourished.  Well appearing. Airway  patent. Patient able to handle his own secretions well. No trouble breathing or swallowing.  HENT:  Head: Normocephalic and atraumatic.  Nose: Nose normal.  Patient with apparent swelling exterior portion of gums besides left lower molars and broken tooth #19. There is tenderness to palpation and redness to the area on gums. It is about 1 cm in length. Poor dentition.   Eyes: Conjunctivae and EOM are normal.  Neck: Normal range of motion.  No swelling noted on neck.  Mildly tender to left lower jaw.   Cardiovascular: Normal rate and normal heart sounds.   Pulmonary/Chest: Effort normal and breath sounds normal. No respiratory distress.  Normal work of breathing. No respiratory distress noted.   Abdominal: Soft.  Musculoskeletal: Normal range of motion.  Neurological: He is alert.  Skin: Skin is warm.  Psychiatric: He has a normal mood and affect. His behavior is normal.  Nursing note and vitals reviewed.    ED Treatments / Results   DIAGNOSTIC STUDIES: Oxygen Saturation is 100% on RA, normal by my interpretation.    COORDINATION OF CARE: 3:19 PM Discussed treatment plan with pt at bedside which includes antibiotics and pt agreed to plan.   Labs (all labs ordered are listed, but only abnormal results are displayed) Labs Reviewed - No data to display  EKG  EKG Interpretation None       Radiology No results found.  Procedures .Marland KitchenIncision and Drainage Date/Time: 04/30/2016 4:32 PM Performed by: Cameron Carrillo Authorized by: Cameron Carrillo   Consent:    Consent obtained:  Verbal   Consent given by:  Patient   Risks discussed:  Incomplete drainage, infection, bleeding and pain   Alternatives discussed:  No treatment and delayed treatment Location:    Type:  Abscess   Size:  1 cm   Location:  Mouth   Mouth location:  Floor of mouth Anesthesia (see MAR for exact dosages):    Anesthesia method:  Nerve block   Block anesthetic:  Bupivacaine 0.5% WITH epi   Block technique:  Dental block   Block injection procedure:  Anatomic landmarks identified, introduced needle, negative aspiration for blood, anatomic landmarks palpated and incremental injection   Block outcome:  Anesthesia achieved Procedure type:    Complexity:  Simple Procedure details:    Incision types:  Stab incision   Incision depth:  Submucosal   Scalpel blade:  11   Wound management:  Irrigated with saline   Drainage:  Serosanguinous   Drainage  amount:  Moderate   Wound treatment:  Wound left open   Packing materials:  None Post-procedure details:    Patient tolerance of procedure:  Tolerated well, no immediate complications    (including critical care time)  Medications Ordered in ED Medications  lidocaine (PF) (XYLOCAINE) 1 % injection 5 mL (5 mLs Infiltration Not Given 04/30/16 1632)  bupivacaine-epinephrine (MARCAINE W/ EPI) 0.5% -1:200000 injection 1.8 mL (1.8 mLs Infiltration Given by Other 04/30/16 1632)     Initial Impression / Assessment and Plan / ED Course  I have reviewed the triage vital signs and the nursing notes.  Pertinent labs & imaging results that were available during my care of the patient were reviewed by me and considered in my medical decision making (see chart for details).    Patient with apparent dental abscess. Abscess requiring immediate incision and drainage. Exam not concerning for Ludwig's angina or pharyngeal abscess. Will treat with antibiotics. Encouraged mouth soaks and rinsing. Pt instructed to follow-up with dentist  using dental resource guide. Discussed return precautions. Pt safe for discharge.   Final Clinical Impressions(s) / ED Diagnoses   Final diagnoses:  Dental abscess    New Prescriptions New Prescriptions   PENICILLIN V POTASSIUM (VEETID) 500 MG TABLET    Take 1 tablet (500 mg total) by mouth 4 (four) times daily.   I personally performed the services described in this documentation, which was scribed in my presence. The recorded information has been reviewed and is accurate.    [address] [place], [place] 04/30/16 2107    Gerhard Munch, MD 05/01/16 [phone]

## 2016-04-30 NOTE — Discharge Instructions (Signed)
Please continue warm rinses in the mouth. Please follow up with a dentist using the dental resource guide attached. Please use ibuprofen or Tylenol as needed for pain.  Get help right away if: You have a fever or chills. Your symptoms suddenly get worse. You have a very bad headache. You have problems breathing or swallowing. You have trouble opening your mouth. You have swelling in your neck or around your eye.

## 2016-04-30 NOTE — ED Notes (Signed)
Patient was alert, oriented and stable upon discharge. RN went over AVS and patient had no further questions.  

## 2016-08-27 ENCOUNTER — Emergency Department (HOSPITAL_COMMUNITY)
Admission: EM | Admit: 2016-08-27 | Discharge: 2016-08-27 | Disposition: A | Discharge status: Home / Self Care | Payer: Self-pay | Attending: Emergency Medicine | Admitting: Emergency Medicine

## 2016-08-27 ENCOUNTER — Encounter (HOSPITAL_COMMUNITY): Payer: Self-pay | Admitting: Emergency Medicine

## 2016-08-27 DIAGNOSIS — F17200 Nicotine dependence, unspecified, uncomplicated: Secondary | ICD-10-CM | POA: Insufficient documentation

## 2016-08-27 DIAGNOSIS — Z202 Contact with and (suspected) exposure to infections with a predominantly sexual mode of transmission: Secondary | ICD-10-CM | POA: Insufficient documentation

## 2016-08-27 DIAGNOSIS — Z79899 Other long term (current) drug therapy: Secondary | ICD-10-CM | POA: Insufficient documentation

## 2016-08-27 MED ORDER — AZITHROMYCIN 250 MG PO TABS
1000.0000 mg | ORAL_TABLET | Freq: Every day | ORAL | Status: DC
  Administered 2016-08-27: 1000 mg via ORAL
  Filled 2016-08-27: qty 4

## 2016-08-27 MED ORDER — CEFTRIAXONE SODIUM 250 MG IJ SOLR
250.0000 mg | Freq: Once | INTRAMUSCULAR | Status: AC
  Administered 2016-08-27: 250 mg via INTRAMUSCULAR
  Filled 2016-08-27: qty 250

## 2016-08-27 MED ORDER — STERILE WATER FOR INJECTION IJ SOLN
INTRAMUSCULAR | Status: AC
  Filled 2016-08-27: qty 10

## 2016-08-27 NOTE — ED Provider Notes (Signed)
[facility]-EMER[facility]ENCY DEPT Provider Note   CSN: [id number] Arrival date & time: 08/27/16  1025  By signing my name below, I, Rosario AdieWilliam Andrew Hiatt, attest that this documentation has been prepared under the direction and in the presence of Mickey Esguerra, PA-C.  Electronically Signed: Rosario AdieWilliam Andrew Hiatt, ED Scribe. 08/27/16. 11:26 AM.  History   Chief Complaint Chief Complaint  Patient presents with  . Exposure to STD   The history is provided by the patient. No language interpreter was used.    HPI Comments: Cameron Carrillo is a 41 y.o. male with no pertinent PMHx, who presents to the Emergency Department following exposure to Chlamydia approximately one week ago. Pt reports that one of his recent partners informed him that she was dx'd w/ Chlamydia last week; he has not had sexual intercourse with her since his last encounter with her. He is currently asymptomatic and otherwise feeling at his baseline. He denies penile discharge, dysuria, hematuria, rash, testicular pain, testicular swelling, fever, chills, or any other associated symptoms.   History reviewed. No pertinent past medical history.  There are no active problems to display for this patient.  History reviewed. No pertinent surgical history.  Home Medications    Prior to Admission medications   Medication Sig Start Date End Date Taking? Authorizing Provider  clindamycin (CLEOCIN) 150 MG capsule Take 2 capsules (300 mg total) by mouth 3 (three) times daily. May dispense as 150mg  capsules 08/21/14   Roxy HorsemanBrowning, Sender, PA-C  hydrochlorothiazide (HYDRODIURIL) 25 MG tablet Take 1 tablet (25 mg total) by mouth daily. 01/16/14   Elson AreasSofia, Leslie K, PA-C  HYDROcodone-acetaminophen (NORCO) 5-325 MG per tablet Take 1 tablet by mouth every 6 (six) hours as needed for moderate pain. 08/18/14   Charm RingsHonig, Erin J, MD  ibuprofen (ADVIL,MOTRIN) 800 MG tablet Take 1 tablet (800 mg total) by mouth every 8 (eight) hours as needed. 08/18/14   Charm RingsHonig,  Erin J, MD  OVER THE COUNTER MEDICATION Take 2 tablets by mouth 2 (two) times daily. Testosterone booster    [provider]  oxyCODONE-acetaminophen (PERCOCET) 10-325 MG tablet Take 1 tablet by mouth every 4 (four) hours as needed for pain. 10/14/15   Tharon AquasPatrick, Frank C, PA   Family History History reviewed. No pertinent family history.  Social History Social History  Substance Use Topics  . Smoking status: Current Every Day Smoker  . Smokeless tobacco: Never Used  . Alcohol use Yes     Comment: Daily, 2 glasses of brandy   Allergies   Bee venom  Review of Systems Review of Systems  Constitutional: Negative for chills and fever.  Genitourinary: Negative for discharge, dysuria, hematuria, scrotal swelling and testicular pain.  Skin: Negative for rash.   Physical Exam Updated Vital Signs BP 131/80 (BP Location: Right Arm)   Pulse 72   Temp 98.6 F (37 C) (Oral)   Resp 18   SpO2 99%   Physical Exam  Constitutional: He appears well-developed and well-nourished. No distress.  Eyes: Conjunctivae are normal.  Neck: Neck supple.  Cardiovascular: Normal rate.   Pulmonary/Chest: No respiratory distress.  Abdominal: He exhibits no distension.  Skin: Skin is warm and dry.  Nursing note and vitals reviewed.  ED Treatments / Results  DIAGNOSTIC STUDIES: Oxygen Saturation is 99% on RA, normal by my interpretation.   COORDINATION OF CARE: 11:17 AM-Discussed next steps with pt. Pt verbalized understanding and is agreeable with the plan.   Labs (all labs ordered are listed, but only abnormal results  are displayed) Labs Reviewed - No data to display  EKG  EKG Interpretation None      Radiology No results found.  Procedures Procedures   Medications Ordered in ED Medications - No data to display  Initial Impression / Assessment and Plan / ED Course  I have reviewed the triage vital signs and the nursing notes.  Pertinent labs & imaging results that were  available during my care of the patient were reviewed by me and considered in my medical decision making (see chart for details).     Patient with exposure to Chlamydia, states that his girlfriend got tested positive. No other STD exposure reported. He is asymptomatic. Requested to be treated, will treat with Zithromax 1 g, Rocephin 250 mg IM. Follow-up with health department as needed. Discussed safe sex practice, using protection, no intercourse for a few days.  Vitals:   08/27/16 1035  BP: 131/80  Pulse: 72  Resp: 18  Temp: 98.6 F (37 C)  TempSrc: Oral  SpO2: 99%     Final Clinical Impressions(s) / ED Diagnoses   Final diagnoses:  STD exposure   New Prescriptions New Prescriptions   No medications on file   I personally performed the services described in this documentation, which was scribed in my presence. The recorded information has been reviewed and is accurate.    Jaynie Crumble, PA-C 08/27/16 1134    Arby Barrette, MD 08/27/16 [phone]

## 2016-08-27 NOTE — Discharge Instructions (Signed)
No intercourse for several days. Follow up with health dept as needed.

## 2016-08-27 NOTE — ED Triage Notes (Signed)
Pt sts possibly exposed to chlamydia

## 2018-07-11 IMAGING — DX DG ANKLE COMPLETE 3+V*L*
3 series · 3 of 3 positions shown · non-contrast
Comparison: None.

CLINICAL DATA: Patient fell on [REDACTED], left foot hyperextended.
Patient hurt multiple pockets in left ankle. Patient reports pain
over the lateral malleolus anterior ankle I radiates up the
posterior legs. Patient is experiencing swelling.

EXAM:
LEFT ANKLE COMPLETE - 3+ VIEW

[ankle ap]
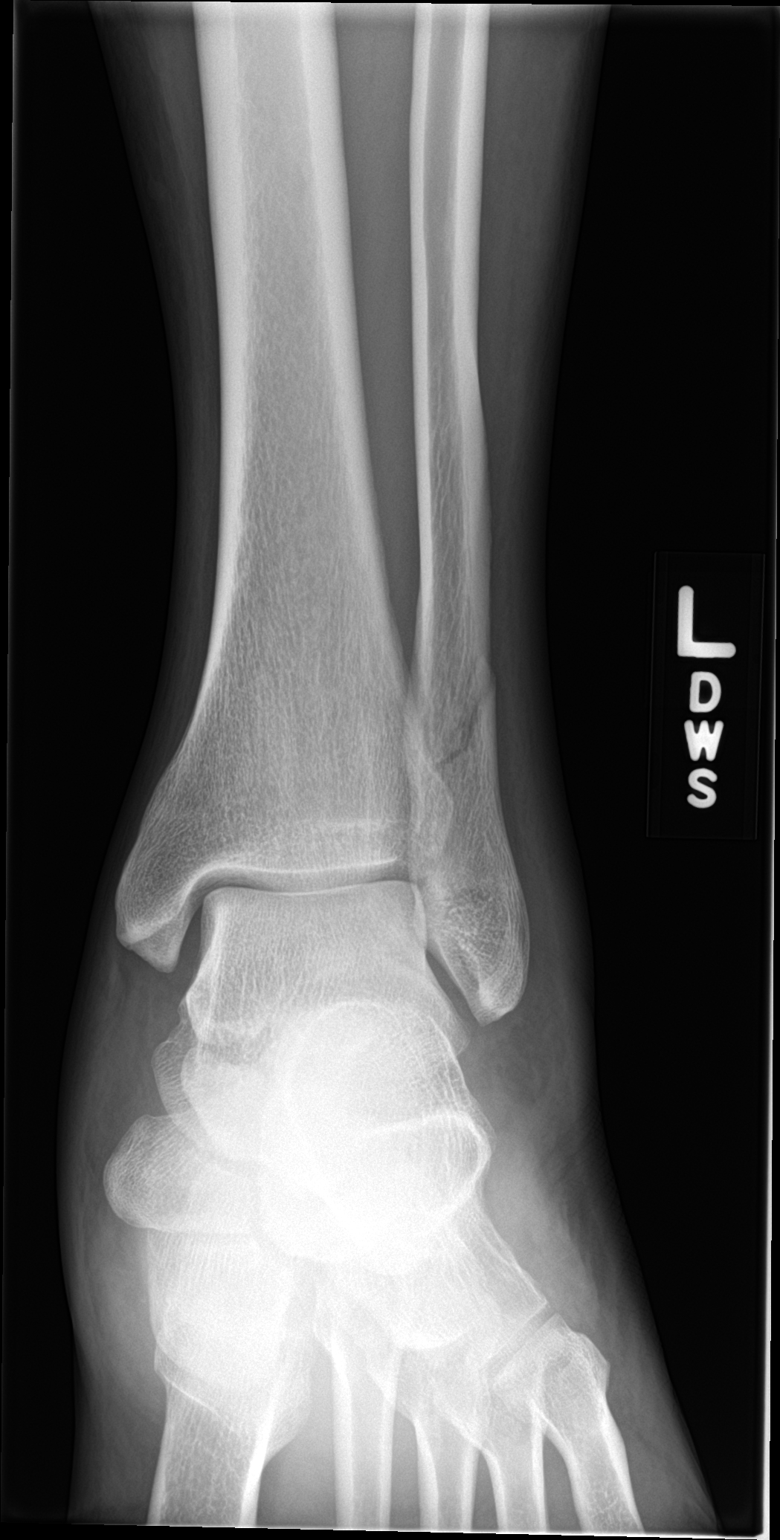

[ankle obl]
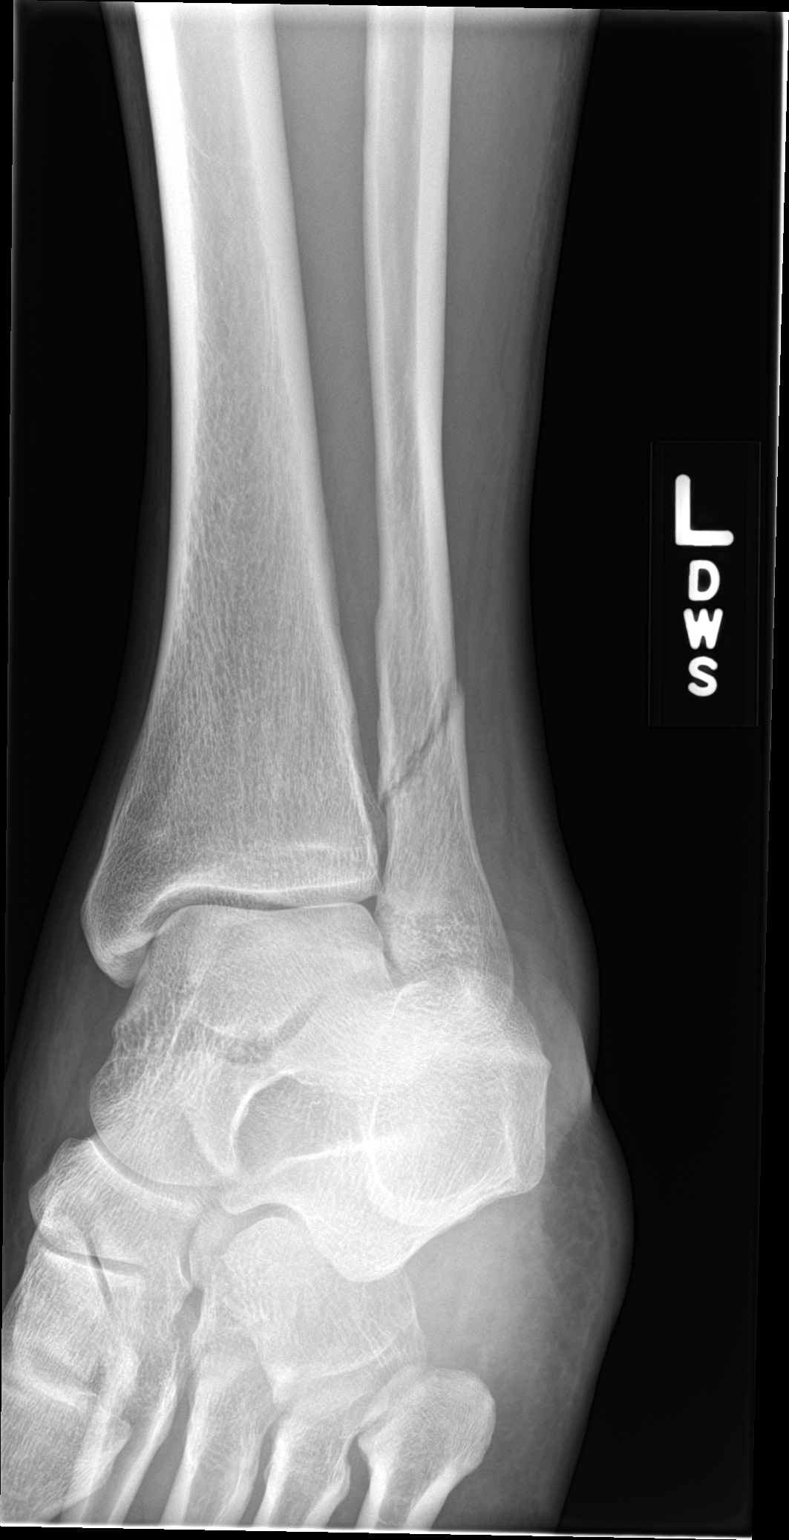

[ankle lat]
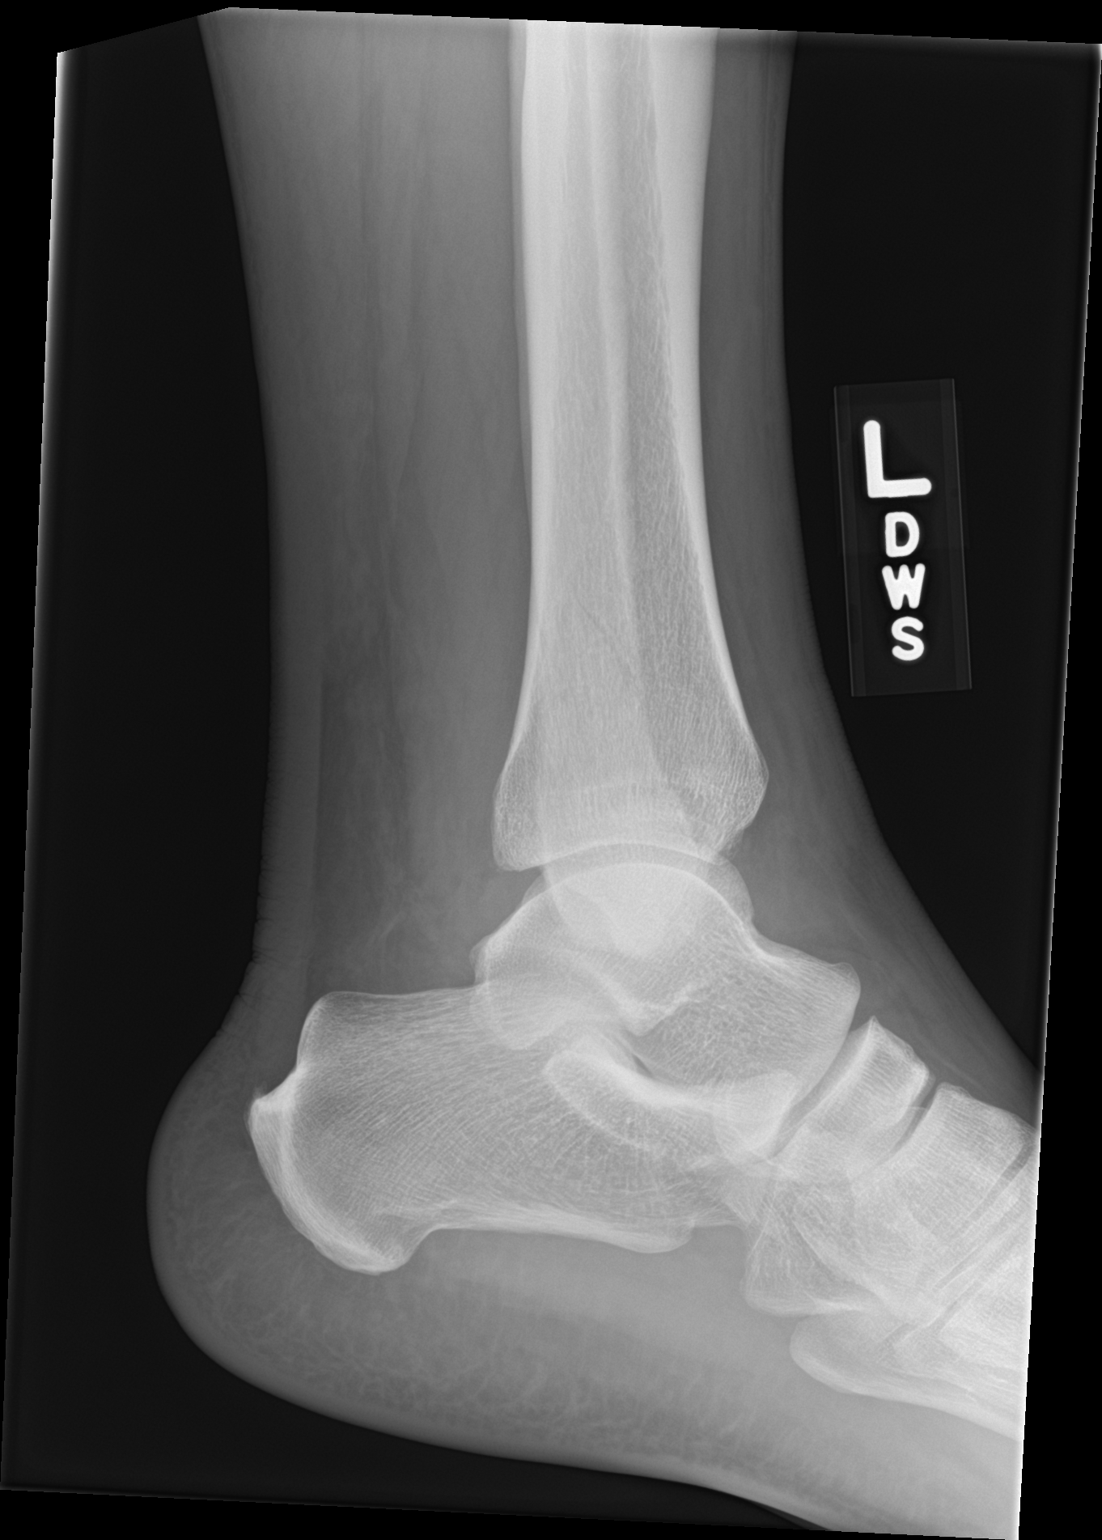

[3 of 3 positions shown; findings below may reference images not displayed]

FINDINGS: AP, oblique and lateral views of the left ankle. There is a
nondisplaced oblique fracture involving the distal fibular
diaphysis. The ankle mortise is grossly symmetric. There is a small
posterior calcaneal bone spur. Soft tissues demonstrate swelling
laterally.
IMPRESSION: Nondisplaced fracture involving the distal fibula with overlying
soft tissue swelling.

## 2020-10-15 ENCOUNTER — Emergency Department (HOSPITAL_COMMUNITY): Payer: Self-pay

## 2020-10-15 ENCOUNTER — Encounter (HOSPITAL_COMMUNITY): Payer: Self-pay | Admitting: Emergency Medicine

## 2020-10-15 ENCOUNTER — Other Ambulatory Visit: Payer: Self-pay

## 2020-10-15 ENCOUNTER — Emergency Department (HOSPITAL_COMMUNITY)
Admission: EM | Admit: 2020-10-15 | Discharge: 2020-10-15 | Disposition: A | Discharge status: Home / Self Care | Payer: Self-pay | Attending: Medical | Admitting: Medical

## 2020-10-15 DIAGNOSIS — Z5321 Procedure and treatment not carried out due to patient leaving prior to being seen by health care provider: Secondary | ICD-10-CM | POA: Insufficient documentation

## 2020-10-15 DIAGNOSIS — R002 Palpitations: Secondary | ICD-10-CM | POA: Insufficient documentation

## 2020-10-15 LAB — CBC WITH DIFFERENTIAL/PLATELET
Abs Immature Granulocytes: 0.13 10*3/uL — ABNORMAL HIGH (ref 0.00–0.07)
Basophils Absolute: 0.1 10*3/uL (ref 0.0–0.1)
Basophils Relative: 1 %
Eosinophils Absolute: 0.2 10*3/uL (ref 0.0–0.5)
Eosinophils Relative: 2 %
HCT: 44.8 % (ref 39.0–52.0)
Hemoglobin: 15.4 g/dL (ref 13.0–17.0)
Immature Granulocytes: 1 %
Lymphocytes Relative: 43 %
Lymphs Abs: 4 10*3/uL (ref 0.7–4.0)
MCH: 30.4 pg (ref 26.0–34.0)
MCHC: 34.4 g/dL (ref 30.0–36.0)
MCV: 88.4 fL (ref 80.0–100.0)
Monocytes Absolute: 0.5 10*3/uL (ref 0.1–1.0)
Monocytes Relative: 6 %
Neutro Abs: 4.4 10*3/uL (ref 1.7–7.7)
Neutrophils Relative %: 47 %
Platelets: 223 10*3/uL (ref 150–400)
RBC: 5.07 MIL/uL (ref 4.22–5.81)
RDW: 13.2 % (ref 11.5–15.5)
WBC: 9.3 10*3/uL (ref 4.0–10.5)
nRBC: 0 % (ref 0.0–0.2)

## 2020-10-15 LAB — BASIC METABOLIC PANEL
Anion gap: 7 (ref 5–15)
BUN: 15 mg/dL (ref 6–20)
CO2: 26 mmol/L (ref 22–32)
Calcium: 9.2 mg/dL (ref 8.9–10.3)
Chloride: 105 mmol/L (ref 98–111)
Creatinine, Ser: 1.06 mg/dL (ref 0.61–1.24)
GFR, Estimated: >60 mL/min (ref >60)
Glucose, Bld: 121 mg/dL — ABNORMAL HIGH (ref 70–99)
Potassium: 3.7 mmol/L (ref 3.5–5.1)
Sodium: 138 mmol/L (ref 135–145)

## 2020-10-15 LAB — TROPONIN I (HIGH SENSITIVITY): Troponin I (High Sensitivity): <2 ng/L (ref <18)

## 2020-10-15 NOTE — ED Provider Notes (Signed)
Emergency Medicine Provider Triage Evaluation Note  Cameron Carrillo , a 45 y.o. male  was evaluated in triage.  Pt complains of intermittent heart palpitations/fluttering for the past 6 months. He reports every time this happens he feels a "pop in my brain" like a "rubber band" and then he gets a "jolt of energy" through his body. Afterwards his heart will feel like it is beating regular. His aunt told him to get his BP checked and he did today and it was 145/84. Per chart review pt has been prescribed HCTZ in 2015 however he denies history of high blood pressure in the past. He denies chest pain. Not currently experiencing those symptoms.   Review of Systems  Positive: + heart fluttering Negative: - chest pain, SOB, headache, vision changes, unilateral weakness or numbness  Physical Exam  BP (!) 164/102 (BP Location: Right Arm)   Pulse 93   Temp 99.3 F (37.4 C) (Oral)   Resp 18   Ht 6\' 1"  (1.854 m)   Wt 100.7 kg   SpO2 98%   BMI 29.29 kg/m  Gen:   Awake, no distress   Resp:  Normal effort  MSK:   Moves extremities without difficulty  Other:  Neuro intact  Medical Decision Making  Medically screening exam initiated at 1:51 PM.  Appropriate orders placed.  was informed that the remainder of the evaluation will be completed by another provider, this initial triage assessment does not replace that evaluation, and the importance of remaining in the ED until their evaluation is complete.     [name], PA-C 10/15/20 1354    12/15/20, MD 10/18/20 1150

## 2020-10-15 NOTE — ED Triage Notes (Signed)
Complains of an irregular heart rate' and a 'rubber band snapping in his head' x6 months. States last night it felt different, was unable to fall asleep. Denies pain. States his BP at home was 145/100, has no PCP.

## 2020-10-18 ENCOUNTER — Other Ambulatory Visit: Payer: Self-pay

## 2020-10-18 ENCOUNTER — Ambulatory Visit: Payer: Self-pay | Admitting: Physician Assistant

## 2020-10-18 VITALS — BP 120/78 | HR 72 | Temp 98.2°F | Resp 18 | Ht 73.5 in | Wt 227.0 lb

## 2020-10-18 DIAGNOSIS — R030 Elevated blood-pressure reading, without diagnosis of hypertension: Secondary | ICD-10-CM | POA: Insufficient documentation

## 2020-10-18 DIAGNOSIS — F439 Reaction to severe stress, unspecified: Secondary | ICD-10-CM

## 2020-10-18 DIAGNOSIS — Z13220 Encounter for screening for lipoid disorders: Secondary | ICD-10-CM

## 2020-10-18 DIAGNOSIS — F17210 Nicotine dependence, cigarettes, uncomplicated: Secondary | ICD-10-CM

## 2020-10-18 DIAGNOSIS — Z720 Tobacco use: Secondary | ICD-10-CM | POA: Insufficient documentation

## 2020-10-18 DIAGNOSIS — Z114 Encounter for screening for human immunodeficiency virus [HIV]: Secondary | ICD-10-CM

## 2020-10-18 DIAGNOSIS — Z6829 Body mass index (BMI) 29.0-29.9, adult: Secondary | ICD-10-CM | POA: Insufficient documentation

## 2020-10-18 DIAGNOSIS — Z1159 Encounter for screening for other viral diseases: Secondary | ICD-10-CM

## 2020-10-18 DIAGNOSIS — R002 Palpitations: Secondary | ICD-10-CM

## 2020-10-18 DIAGNOSIS — E782 Mixed hyperlipidemia: Secondary | ICD-10-CM

## 2020-10-18 NOTE — Patient Instructions (Addendum)
I do encourage you to drink more water , you should drink 80 ounces a day  I would like you to start checking your blood pressure on a daily basis, keep a written log and have available for all office visits.  Write a letter to yourself and give yourself instructions on how you are going to get to the next steps you desire.   We will call you with your lab results.  Cameron Rad, PA-C Physician Assistant [place] [email or web address]   How to Take Your Blood Pressure Blood pressure is a measurement of how strongly your blood is pressing against the walls of your arteries. Arteries are blood vessels that carry blood from your heart throughout your body. Your health care provider takes your blood pressure at each office visit. You can also take your own blood pressure at home with a blood pressure monitor. You may need to take your own blood pressure to: Confirm a diagnosis of high blood pressure (hypertension). Monitor your blood pressure over time. Make sure your blood pressure medicine is working. Supplies needed: Blood pressure monitor. Dining room chair to sit in. Table or desk. Small notebook and pencil or pen. How to prepare To get the most accurate reading, avoid the following for 30 minutes before you check your blood pressure: Drinking caffeine. Drinking alcohol. Eating. Smoking. Exercising. Five minutes before you check your blood pressure: Use the bathroom and urinate so that you have an empty bladder. Sit quietly in a dining room chair. Do not sit in a soft couch or an armchair. Do not talk. How to take your blood pressure To check your blood pressure, follow the instructions in the manual that came with your blood pressure monitor. If you have a digital blood pressure monitor, the instructions may be as follows: Sit up straight in a chair. Place your feet on the floor. Do not cross your ankles or legs. Rest  your left arm at the level of your heart on a table or desk or on the arm of a chair. Pull up your shirt sleeve. Wrap the blood pressure cuff around the upper part of your left arm, 1 inch (2.5 cm) above your elbow. It is best to wrap the cuff around bare skin. Fit the cuff snugly around your arm. You should be able to place only one finger between the cuff and your arm. Position the cord so that it rests in the bend of your elbow. Press the power button. Sit quietly while the cuff inflates and deflates. Read the digital reading on the monitor screen and write the numbers down (record them) in a notebook. Wait 2-3 minutes, then repeat the steps, starting at step 1. What does my blood pressure reading mean? A blood pressure reading consists of a higher number over a lower number. Ideally, your blood pressure should be below 120/80. The first ("top") number is called the systolic pressure. It is a measure of the pressure in your arteries as your heart beats. The second ("bottom") number is called the diastolic pressure. It is a measure of the pressure in your arteries as the heart relaxes. Blood pressure is classified into five stages. The following are the stages for adults who do not have a short-term serious illness or a chronic condition. Systolic pressure and diastolic pressure are measured in a unit called mm Hg (millimeters of mercury).  Normal Systolic pressure: below 161. Diastolic pressure: below 80. Elevated Systolic pressure: 096-045. Diastolic pressure: below 80. Hypertension stage  1 Systolic pressure: 546-270. Diastolic pressure: 35-00. Hypertension stage 2 Systolic pressure: 938 or above. Diastolic pressure: 90 or above. You can have elevated blood pressure or hypertension even if only the systolic or only the diastolic number in your reading is higher than normal. Follow these instructions at home: Medicines Take over-the-counter and prescription medicines only as told by  your health care provider. Tell your health care provider if you are having any side effects from blood pressure medicine. General instructions Check your blood pressure as often as recommended by your health care provider. Check your blood pressure at the same time every day. Take your monitor to the next appointment with your health care provider to make sure that: You are using it correctly. It provides accurate readings. Understand what your goal blood pressure numbers are. Keep all follow-up visits as told by your health care provider. This is important. General tips Your health care provider can suggest a reliable monitor that will meet your needs. There are several types of home blood pressure monitors. Choose a monitor that has an arm cuff. Do not choose a monitor that measures your blood pressure from your wrist or finger. Choose a cuff that wraps snugly around your upper arm. You should be able to fit only one finger between your arm and the cuff. You can buy a blood pressure monitor at most drugstores or online. Where to find more information American Heart Association: www.heart.org Contact a health care provider if: Your blood pressure is consistently high. Your blood pressure is suddenly low. Get help right away if: Your systolic blood pressure is higher than 180. Your diastolic blood pressure is higher than 120. Summary Blood pressure is a measurement of how strongly your blood is pressing against the walls of your arteries. A blood pressure reading consists of a higher number over a lower number. Ideally, your blood pressure should be below 120/80. Check your blood pressure at the same time every day. Avoid caffeine, alcohol, smoking, and exercise for 30 minutes prior to checking your blood pressure. These agents can affect the accuracy of the blood pressure reading. This information is not intended to replace advice given to you by your health care provider. Make sure you  discuss any questions you have with your health care provider. Document Revised: 12/03/2019 Document Reviewed: 01/17/2019 Elsevier Patient Education  2022 [place], Adult Feeling a certain amount of stress is normal. Stress helps our body and mind get ready to deal with the demands of life. Stress hormones can motivate you to do well at work and meet your responsibilities. However severe or long-lasting (chronic) stress can affect your mental and physical health. Chronic stress puts you at higher risk for anxiety, depression, and other health problems like digestive problems, muscle aches, heart disease, high blood pressure, and stroke. What are the causes? Common causes of stress include: Demands from work, such as deadlines, feeling overworked, or having long hours. Pressures at home, such as money issues, disagreements with a spouse, or parenting issues. Pressures from major life changes, such as divorce, moving, loss of a loved one, or chronic illness. You may be at higher risk for stress-related problems if you do not get enough sleep, are in poor health, do not have emotional support, or have a mental health disorder like anxiety or depression. How to recognize stress Stress can make you: Have trouble sleeping. Feel sad, anxious, irritable, or overwhelmed. Lose your appetite. Overeat or want to eat unhealthy foods. Want  to use drugs or alcohol. Stress can also cause physical symptoms, such as: Sore, tense muscles, especially in the shoulders and neck. Headaches. Trouble breathing. A faster heart rate. Stomach pain, nausea, or vomiting. Diarrhea or constipation. Trouble concentrating. Follow these instructions at home: Lifestyle Identify the source of your stress and your reaction to it. See a therapist who can help you change your reactions. When there are stressful events: Talk about it with family, friends, or co-workers. Try to think realistically about  stressful events and not ignore them or overreact. Try to find the positives in a stressful situation and not focus on the negatives. Cut back on responsibilities at work and home, if possible. Ask for help from friends or family members if you need it. Find ways to cope with stress, such as: Meditation. Deep breathing. Yoga or tai chi. Progressive muscle relaxation. Doing art, playing music, or reading. Making time for fun activities. Spending time with family and friends. Get support from family, friends, or spiritual resources. Eating and drinking Eat a healthy diet. This includes: Eating foods that are high in fiber, such as beans, whole grains, and fresh fruits and vegetables. Limiting foods that are high in fat and processed sugars, such as fried and sweet foods. Do not skip meals or overeat. Drink enough fluid to keep your urine pale yellow. Alcohol use Do not drink alcohol if: Your health care provider tells you not to drink. You are pregnant, may be pregnant, or are planning to become pregnant. Drinking alcohol is a way some people try to ease their stress. This can be dangerous, so if you drink alcohol: Limit how much you use to: 0-1 drink a day for women. 0-2 drinks a day for men. Be aware of how much alcohol is in your drink. In the [place] one drink equals one 12 oz bottle of beer (355 mL), one 5 oz glass of wine (148 mL), or one 1 oz glass of hard liquor (44 mL). Activity  Include 30 minutes of exercise in your daily schedule. Exercise is a good stress reducer. Include time in your day for an activity that you find relaxing. Try taking a walk, going on a bike ride, reading a book, or listening to music. Schedule your time in a way that lowers stress, and keep a consistent schedule. Prioritize what is most important to get done. General instructions Get enough sleep. Try to go to sleep and get up at about the same time every day. Take over-the-counter and prescription  medicines only as told by your health care provider. Do not use any products that contain nicotine or tobacco, such as cigarettes, e-cigarettes, and chewing tobacco. If you need help quitting, ask your health care provider. Do not use drugs or smoke to cope with stress. Keep all follow-up visits as told by your health care provider. This is important. Where to find support Talk with your health care provider about stress management or finding a support group. Find a therapist to work with you on your stress management techniques. Contact a health care provider if: Your stress symptoms get worse. You are unable to manage your stress at home. You are struggling to stop using drugs or alcohol. Get help right away if: You may be a danger to yourself or others. You have any thoughts of death or suicide. If you ever feel like you may hurt yourself or others, or have thoughts about taking your own life, get help right away. You can go to your  nearest emergency department or call: Your local emergency services (911 in the [place] A suicide crisis helpline, such as the [facility] at 206-608-4655. This is open 24 hours a day. Summary Feeling a certain amount of stress is normal, but severe or long-lasting (chronic) stress can affect your mental and physical health. Chronic stress can put you at higher risk for anxiety, depression, and other health problems like digestive problems, muscle aches, heart disease, high blood pressure, and stroke. You may be at higher risk for stress-related problems if you do not get enough sleep, are in poor health, lack emotional support, or have a mental health disorder like anxiety or depression. Identify the source of your stress and your reaction to it. Try talking about stressful events with family, friends, or co-workers, finding a coping method, or getting support from spiritual resources. If you need more help, talk with your health care  provider about finding a support group or a mental health therapist. This information is not intended to replace advice given to you by your health care provider. Make sure you discuss any questions you have with your health care provider. Document Revised: 04/02/2020 Document Reviewed: 08/21/2018 Elsevier Patient Education  [place].

## 2020-10-18 NOTE — Progress Notes (Signed)
New Patient Office Visit  Subjective:  Patient ID: Cameron Carrillo, male    DOB: [birth year]  Age: 45 y.o. MRN: [id number]  CC:  Chief Complaint  Patient presents with   Irregular Heart Beat    HPI Cameron Carrillo reports that he has been feeling an irregular heartbeat, states that it mostly occurs at night when he is going to sleep, states it has been going on for the last 6 or 7 months.  Denies chest pain, shortness of breath.  Denies heartburn or acid reflux  Reports that he has been having increased stressors, otherwise sleep is good (does endorse snoring), appetite is good, is drinking 3-4 bottles of water a day, endorses healthy diet.  States that he had his blood pressure checked at home a few nights ago and reading was slightly elevated, states that he has relatives in the medical field and when he told them about his feeling of irregular heart rate they encouraged him to go to the emergency department.  In the emergency department his blood pressure was again slightly elevated, states he did have a normal EKG and a normal troponin level.   Does state that he was unaware that he was previously prescribed hydrochlorothiazide in 2015, states that he never took this medication.  Does not take any over-the-counter supplements     History reviewed. No pertinent past medical history.  History reviewed. No pertinent surgical history.  History reviewed. No pertinent family history.  Social History   Socioeconomic History   Marital status: Single    Spouse name: Not on file   Number of children: Not on file   Years of education: Not on file   Highest education level: Not on file  Occupational History   Not on file  Tobacco Use   Smoking status: Every Day   Smokeless tobacco: Never  Substance and Sexual Activity   Alcohol use: Yes    Comment: 1 drink q 2 months now was daily, 2 glasses of brandy   Drug use: No    Types: Marijuana    Comment: stopped october of last  year   Sexual activity: Not Currently  Other Topics Concern   Not on file  Social History Narrative   Not on file   Social Determinants of Health   Financial Resource Strain: Not on file  Food Insecurity: Not on file  Transportation Needs: Not on file  Physical Activity: Not on file  Stress: Not on file  Social Connections: Not on file  Intimate Partner Violence: Not on file    ROS Review of Systems  Constitutional: Negative.   HENT: Negative.    Eyes: Negative.   Respiratory:  Negative for shortness of breath.   Cardiovascular:  Positive for palpitations. Negative for chest pain.  Gastrointestinal: Negative.   Endocrine: Negative.   Musculoskeletal: Negative.   Skin: Negative.   Allergic/Immunologic: Negative.   Neurological:  Negative for syncope and headaches.  Hematological: Negative.   Psychiatric/Behavioral:  Positive for dysphoric mood. Negative for self-injury, sleep disturbance and suicidal ideas. The patient is nervous/anxious.    Objective:   Today's Vitals: BP 120/78 (BP Location: Left Arm, Patient Position: Sitting, Cuff Size: Large)   Pulse 72   Temp 98.2 F (36.8 C) (Oral)   Resp 18   Ht 6' 1.5" (1.867 m)   Wt 227 lb (103 kg)   SpO2 100%   BMI 29.54 kg/m   Physical Exam Vitals and nursing note reviewed.  Constitutional:  Appearance: Normal appearance.  HENT:     Head: Normocephalic and atraumatic.     Right Ear: External ear normal.     Left Ear: External ear normal.     Nose: Nose normal.     Mouth/Throat:     Mouth: Mucous membranes are moist.     Pharynx: Oropharynx is clear.  Eyes:     Extraocular Movements: Extraocular movements intact.     Conjunctiva/sclera: Conjunctivae normal.     Pupils: Pupils are equal, round, and reactive to light.  Cardiovascular:     Rate and Rhythm: Normal rate and regular rhythm.     Pulses: Normal pulses.     Heart sounds: Normal heart sounds. No murmur heard.   No gallop.  Pulmonary:      Effort: Pulmonary effort is normal.     Breath sounds: Normal breath sounds.  Musculoskeletal:        General: Normal range of motion.     Cervical back: Normal range of motion and neck supple.  Skin:    General: Skin is warm and dry.  Neurological:     General: No focal deficit present.     Mental Status: He is alert and oriented to person, place, and time.  Psychiatric:        Attention and Perception: Attention normal.        Mood and Affect: Mood normal.        Speech: Speech normal.        Behavior: Behavior normal.        Thought Content: Thought content does not include homicidal or suicidal ideation.        Cognition and Memory: Cognition normal.        Judgment: Judgment normal.    Assessment & Plan:   Problem List Items Addressed This Visit       Other   Stress - Primary   Relevant Orders   Ambulatory referral to Social Work   Elevated blood pressure reading without diagnosis of hypertension   Palpitations   Relevant Orders   TSH   Tobacco abuse   BMI 29.0-29.9,adult   Other Visit Diagnoses     Screening, lipid       Relevant Orders   Lipid panel   Screening for HIV (human immunodeficiency virus)       Relevant Orders   HIV antibody (with reflex)   Encounter for HCV screening test for low risk patient       Relevant Orders   HCV Ab w Reflex to Quant PCR       Outpatient Encounter Medications as of 10/18/2020  Medication Sig   [DISCONTINUED] hydrochlorothiazide (HYDRODIURIL) 25 MG tablet Take 1 tablet (25 mg total) by mouth daily.   [DISCONTINUED] clindamycin (CLEOCIN) 150 MG capsule Take 2 capsules (300 mg total) by mouth 3 (three) times daily. May dispense as 150mg  capsules   [DISCONTINUED] HYDROcodone-acetaminophen (NORCO) 5-325 MG per tablet Take 1 tablet by mouth every 6 (six) hours as needed for moderate pain.   [DISCONTINUED] ibuprofen (ADVIL,MOTRIN) 800 MG tablet Take 1 tablet (800 mg total) by mouth every 8 (eight) hours as needed.    [DISCONTINUED] OVER THE COUNTER MEDICATION Take 2 tablets by mouth 2 (two) times daily. Testosterone booster   [DISCONTINUED] oxyCODONE-acetaminophen (PERCOCET) 10-325 MG tablet Take 1 tablet by mouth every 4 (four) hours as needed for pain.   No facility-administered encounter medications on file as of 10/18/2020.  1. Stress Patient education given on stress relieving  coping skills.  Patient agreeable to referral to clinic counselor.  Patient given application for [facility] financial assistance, patient given appointment to establish care with Dr. Andrey Campanile at [facility] on November 23, 2020. - Ambulatory referral to Social Work  2. Palpitations Patient encouraged to increase water intake, red flags given for prompt reevaluation - TSH  3. Elevated blood pressure reading without diagnosis of hypertension Blood pressure today within normal limits.  Patient encouraged to check blood pressure at home on a daily basis, keep a written log and have available for all office visits.  Patient encouraged to follow-up promptly with the mobile unit if blood pressure readings are elevated.  4. Screening, lipid  \ - Lipid panel  5. Screening for HIV (human immunodeficiency virus)  - HIV antibody (with reflex)  6. Encounter for HCV screening test for low risk patient  - HCV Ab w Reflex to Quant PCR   I have reviewed the patient's medical history (PMH, PSH, Social History, Family History, Medications, and allergies) , and have been updated if relevant. I spent 40 minutes reviewing chart and  face to face time with patient.    Follow-up: Return in about 5 weeks (around 11/23/2020) for with Dr. Andrey Campanile at [facility].   Kasandra Knudsen Mayers, PA-C

## 2020-10-18 NOTE — Progress Notes (Signed)
Patient has not eaten today. Patient has not taken medication today. Patient denies pain at this time. Patient shares Thursday he had a feeling like a rubber band pop in head which give

## 2020-10-22 LAB — HCV AB W REFLEX TO QUANT PCR: HCV Ab: <0.1 s/co ratio (ref 0.0–0.9)

## 2020-10-22 LAB — LIPID PANEL
Chol/HDL Ratio: 9.9 ratio — ABNORMAL HIGH (ref 0.0–5.0)
Cholesterol, Total: 308 mg/dL — ABNORMAL HIGH (ref 100–199)
HDL: 31 mg/dL — ABNORMAL LOW (ref >39)
LDL Chol Calc (NIH): 241 mg/dL — ABNORMAL HIGH (ref 0–99)
Triglycerides: 183 mg/dL — ABNORMAL HIGH (ref 0–149)
VLDL Cholesterol Cal: 36 mg/dL (ref 5–40)

## 2020-10-22 LAB — HCV INTERPRETATION

## 2020-10-22 LAB — HIV ANTIBODY (ROUTINE TESTING W REFLEX): HIV Screen 4th Generation wRfx: NONREACTIVE

## 2020-10-22 LAB — TSH: TSH: 1.18 u[IU]/mL (ref 0.450–4.500)

## 2020-10-25 ENCOUNTER — Other Ambulatory Visit: Payer: Self-pay

## 2020-10-25 ENCOUNTER — Telehealth: Payer: Self-pay | Admitting: *Deleted

## 2020-10-25 MED ORDER — ATORVASTATIN CALCIUM 10 MG PO TABS
10.0000 mg | ORAL_TABLET | Freq: Every day | ORAL | 1 refills | Status: AC
  Filled 2020-10-25: qty 30, 30d supply, fill #0

## 2020-10-25 NOTE — Telephone Encounter (Signed)
-----   Message from Roney Jaffe, [place] sent at 10/25/2020  1:39 PM EDT ----- Please call patient and let him know that his cholesterol is elevated, his risk of a cardiovascular event in the next 10 years is 9%.  It is recommended that he start medication and follow a low-cholesterol diet.  Prescription sent to his pharmacy.  His thyroid is within normal limits, and his screening for hepatitis C and HIV were negative.  The 10-year ASCVD risk score (Arnett DK, et al., 2019) is: 9.1%   Values used to calculate the score:     Age: 45 years     Sex: Male     Is Non-Hispanic African American: Yes     Diabetic: No     Tobacco smoker: Yes     Systolic Blood Pressure: 120 mmHg     Is BP treated: No     HDL Cholesterol: 31 mg/dL     Total Cholesterol: 308 mg/dL

## 2020-10-25 NOTE — Addendum Note (Signed)
Addended by: Roney Jaffe on: 10/25/2020 01:39 PM   Modules accepted: Orders

## 2020-10-25 NOTE — Telephone Encounter (Signed)
MA UTR to reach patient x2. Patient advised to contact the office back at [phone].

## 2020-11-01 ENCOUNTER — Other Ambulatory Visit: Payer: Self-pay

## 2020-11-23 ENCOUNTER — Ambulatory Visit: Payer: Self-pay | Admitting: Family Medicine

## 2021-06-11 ENCOUNTER — Other Ambulatory Visit: Payer: Self-pay

## 2021-06-11 ENCOUNTER — Emergency Department (HOSPITAL_COMMUNITY)
Admission: EM | Admit: 2021-06-11 | Discharge: 2021-06-11 | Disposition: A | Discharge status: Home / Self Care | Payer: Self-pay | Attending: Emergency Medicine | Admitting: Emergency Medicine

## 2021-06-11 ENCOUNTER — Encounter (HOSPITAL_COMMUNITY): Payer: Self-pay

## 2021-06-11 DIAGNOSIS — X58XXXA Exposure to other specified factors, initial encounter: Secondary | ICD-10-CM | POA: Insufficient documentation

## 2021-06-11 DIAGNOSIS — K0889 Other specified disorders of teeth and supporting structures: Secondary | ICD-10-CM

## 2021-06-11 DIAGNOSIS — S025XXA Fracture of tooth (traumatic), initial encounter for closed fracture: Secondary | ICD-10-CM | POA: Insufficient documentation

## 2021-06-11 MED ORDER — AMOXICILLIN 500 MG PO CAPS: 500.0000 mg | ORAL_CAPSULE | Freq: Three times a day (TID) | ORAL | 0 refills | Status: DC

## 2021-06-11 MED ORDER — IBUPROFEN 800 MG PO TABS: 800.0000 mg | ORAL_TABLET | Freq: Three times a day (TID) | ORAL | 0 refills | Status: DC

## 2021-06-11 NOTE — Discharge Instructions (Signed)
Please pick up antibiotics and take as prescribed.  ? ?I have also prescribed a short course of Ibuprofen 800 mg that you can take every 8 hours as needed for pain. It is recommended that you alternate every 4 hours with Tylenol 1,000 mg (2 OTC double strength tablets) to stay on top of the pain.  ? ?Keep your appointment with your dentist as scheduled soon ? ?Return to the ED for any new/worsening symptoms ?

## 2021-06-11 NOTE — ED Triage Notes (Signed)
Pt had a couple of teeth break off earlier in the week. Pt states there is an abscess. Right side of face is swollen. ?

## 2021-06-11 NOTE — ED Notes (Signed)
Patient given discharge instructions, all questions answered. Patient in possession of all belongings, directed to the discharge area  

## 2021-06-11 NOTE — ED Provider Notes (Signed)
?[facility] EMERGENCY DEPARTMENT ?Provider Note ? ? ?CSN: [id number] ?Arrival date & time: 06/11/21  0355 ? ?  ? ?History ? ?Chief Complaint  ?Patient presents with  ? Dental Pain  ? ? ?Cameron Carrillo is a 46 y.o. male who presents to the ED today with complaint of gradual onset, constant, sharp, right upper dental pain that began 2 days ago.  Patient endorses history of poor dentition throughout with multiple broken teeth.  He states that Thursday he believes larger portion of his tooth chipped off causing worsening pain.  He reports sensitivity to cold water, cold foods, cold air.  He states that he does have an appointment with his dentist on 05/15 however did not feel he could wait that long.  He has been taking Tylenol with minimal relief.  Denies any fevers or chills.  Does endorse some mild right-sided facial swelling.  No other complaints at this time. ? ?The history is provided by the patient and medical records.  ? ?  ? ?Home Medications ?Prior to Admission medications   ?Medication Sig Start Date End Date Taking? Authorizing Provider  ?amoxicillin (AMOXIL) 500 MG capsule Take 1 capsule (500 mg total) by mouth 3 (three) times daily. 06/11/21  Yes Shterna Laramee, PA-C  ?ibuprofen (ADVIL) 800 MG tablet Take 1 tablet (800 mg total) by mouth 3 (three) times daily. 06/11/21  Yes Jonaven Hilgers, PA-C  ?atorvastatin (LIPITOR) 10 MG tablet Take 1 tablet (10 mg total) by mouth daily. 10/25/20   Mayers, Kasandra Knudsen, PA-C  ?   ? ?Allergies    ?Bee venom   ? ?Review of Systems   ?Review of Systems  ?Constitutional:  Negative for chills and fever.  ?HENT:  Positive for dental problem and facial swelling. Negative for ear pain, trouble swallowing and voice change.   ?Respiratory:  Negative for shortness of breath.   ?All other systems reviewed and are negative. ? ?Physical Exam ?Updated Vital Signs ?BP (!) 132/93 (BP Location: Left Arm)   Pulse 85   Temp 98.4 ?F (36.9 ?C) (Oral)   Resp 19   Ht 6\' 1"   (1.854 m)   Wt 105.7 kg   SpO2 99%   BMI 30.74 kg/m?  ?Physical Exam ?Vitals and nursing note reviewed.  ?Constitutional:   ?   Appearance: He is not ill-appearing.  ?HENT:  ?   Head: Normocephalic and atraumatic.  ?   Mouth/Throat:  ?   Comments: Nose clear.  ?R upper tooth #4 decayed and fractured to the gumline with caries with TTP, with minimal surrounding gingival swelling and erythema, no definite abscess, no evidence of ludwig's.  ?Oropharynx clear and moist, without uvular swelling or deviation, no trismus or drooling, no tonsillar swelling or erythema, no exudates.   ?Eyes:  ?   Conjunctiva/sclera: Conjunctivae normal.  ?Cardiovascular:  ?   Rate and Rhythm: Normal rate and regular rhythm.  ?Pulmonary:  ?   Effort: Pulmonary effort is normal.  ?   Breath sounds: Normal breath sounds.  ?Abdominal:  ?   Palpations: Abdomen is soft.  ?   Tenderness: There is no abdominal tenderness.  ?Musculoskeletal:  ?   Cervical back: Neck supple.  ?Skin: ?   General: Skin is warm and dry.  ?Neurological:  ?   Mental Status: He is alert.  ? ? ?ED Results / Procedures / Treatments   ?Labs ?(all labs ordered are listed, but only abnormal results are displayed) ?Labs Reviewed - No data to display ? ?  EKG ?None ? ?Radiology ?No results found. ? ?Procedures ?Procedures  ? ? ?Medications Ordered in ED ?Medications - No data to display ? ?ED Course/ Medical Decision Making/ A&P ?  ?                        ?Medical Decision Making ?46 year old male presents to the ED today with complaint of right upper dental pain for the past 2 days.  On arrival to the ED today vitals are stable.  Patient is afebrile, nontachycardic and nontachypneic.  He appears to be in no acute distress at this time.  He is noted to have fractured tooth #4 with associated dental caries and gingival erythema.  There is no obvious abscess to be drained at this time.  Patient with very minimal right-sided facial swelling.  No concern for Ludwig's angina at this  time however.  We will plan to discharge home with amoxicillin.  Patient has appointment with dentist on 05/15, advised to keep.  He has also been prescribed ibuprofen and advised to alternate ibuprofen and Tylenol as needed for pain.  Patient is in agreement with plan and stable for discharge home. ? ?Problems Addressed: ?Closed fracture of tooth, initial encounter: acute illness or injury ?Pain, dental: acute illness or injury ? ? ? ? ? ? ? ? ? ?Final Clinical Impression(s) / ED Diagnoses ?Final diagnoses:  ?Pain, dental  ?Closed fracture of tooth, initial encounter  ? ? ?Rx / DC Orders ?ED Discharge Orders   ? ?      Ordered  ?  amoxicillin (AMOXIL) 500 MG capsule  3 times daily       ? 06/11/21 0823  ?  ibuprofen (ADVIL) 800 MG tablet  3 times daily       ? 06/11/21 0823  ? ?  ?  ? ?  ? ? ? ?Discharge Instructions   ? ?  ?Please pick up antibiotics and take as prescribed.  ? ?I have also prescribed a short course of Ibuprofen 800 mg that you can take every 8 hours as needed for pain. It is recommended that you alternate every 4 hours with Tylenol 1,000 mg (2 OTC double strength tablets) to stay on top of the pain.  ? ?Keep your appointment with your dentist as scheduled soon ? ?Return to the ED for any new/worsening symptoms ? ? ? ? ?  ?Tanda Rockers, PA-C ?06/11/21 8469 ? ?  ?Terald Sleeper, MD ?06/11/21 1304 ? ?

## 2021-08-16 ENCOUNTER — Ambulatory Visit (HOSPITAL_COMMUNITY)
Admission: EM | Admit: 2021-08-16 | Discharge: 2021-08-16 | Disposition: A | Discharge status: Home / Self Care | Payer: Self-pay | Attending: Student | Admitting: Student

## 2021-08-16 ENCOUNTER — Encounter (HOSPITAL_COMMUNITY): Payer: Self-pay | Admitting: Emergency Medicine

## 2021-08-16 ENCOUNTER — Telehealth (HOSPITAL_COMMUNITY): Payer: Self-pay | Admitting: Student

## 2021-08-16 DIAGNOSIS — K047 Periapical abscess without sinus: Secondary | ICD-10-CM

## 2021-08-16 MED ORDER — AMOXICILLIN-POT CLAVULANATE 875-125 MG PO TABS: 1.0000 | ORAL_TABLET | Freq: Two times a day (BID) | ORAL | 0 refills | Status: AC

## 2021-08-16 MED ORDER — IBUPROFEN 800 MG PO TABS: 800.0000 mg | ORAL_TABLET | Freq: Three times a day (TID) | ORAL | 0 refills | Status: AC

## 2021-08-16 NOTE — ED Triage Notes (Signed)
Pt is present today with c/o dental pain and abscess. Pt states that he noticed the abscess Thursday. Pt has visible swelling on the left side of his face

## 2021-08-16 NOTE — Discharge Instructions (Addendum)
-  Start the antibiotic-Augmentin (amoxicillin-clavulanate), 1 pill every 12 hours for 7 days.  You can take this with food like with breakfast and dinner. -Follow-up with dentist as scheduled in 8/23 -Follow-up sooner if new symptoms like pain under the chin or tongue, weird tastes in your mouth, new fevers, throat closing, etc

## 2021-08-16 NOTE — ED Provider Notes (Addendum)
[facility]    CSN: [id number] Arrival date & time: 08/16/21  0804      History   Chief Complaint Chief Complaint  Patient presents with   Dental Problem    HPI Cameron Carrillo is a 46 y.o. male presenting with dental infection. History dental infections in the past, states distant history of trauma to the jaw which caused dead and broken teeth. He does have a dentist but they cannot see him until August. States pain and swelling L jaw. Denies foul taste in mouth, pain under tongue, pain under jaw, sore throat, fevers.  HPI  History reviewed. No pertinent past medical history.  Patient Active Problem List   Diagnosis Date Noted   Stress 10/18/2020   Elevated blood pressure reading without diagnosis of hypertension 10/18/2020   Palpitations 10/18/2020   Tobacco abuse 10/18/2020   BMI 29.0-29.9,adult 10/18/2020    History reviewed. No pertinent surgical history.     Home Medications    Prior to Admission medications   Medication Sig Start Date End Date Taking? Authorizing Provider  amoxicillin-clavulanate (AUGMENTIN) 875-125 MG tablet Take 1 tablet by mouth every 12 (twelve) hours. 08/16/21  Yes Rhys Martini, PA-C  atorvastatin (LIPITOR) 10 MG tablet Take 1 tablet (10 mg total) by mouth daily. 10/25/20   Mayers, Cari S, PA-C  ibuprofen (ADVIL) 800 MG tablet Take 1 tablet (800 mg total) by mouth 3 (three) times daily. 06/11/21   Tanda Rockers, PA-C    Family History History reviewed. No pertinent family history.  Social History Social History   Tobacco Use   Smoking status: Every Day   Smokeless tobacco: Never  Substance Use Topics   Alcohol use: Yes    Comment: 1 drink q 2 months now was daily, 2 glasses of brandy   Drug use: No    Types: Marijuana    Comment: stopped october of last year     Allergies   Bee venom   Review of Systems Review of Systems  HENT:  Positive for dental problem.   All other systems reviewed and are  negative.    Physical Exam Triage Vital Signs ED Triage Vitals  Enc Vitals Group     BP 08/16/21 0824 (!) 143/73     Pulse Rate 08/16/21 0824 71     Resp 08/16/21 0824 18     Temp 08/16/21 0825 98.4 F (36.9 C)     Temp src --      SpO2 08/16/21 0824 99 %     Weight --      Height --      Head Circumference --      Peak Flow --      Pain Score 08/16/21 0824 10     Pain Loc --      Pain Edu? --      Excl. in GC? --    No data found.  Updated Vital Signs BP (!) 143/73   Pulse 71   Temp 98.4 F (36.9 C)   Resp 18   SpO2 99%   Visual Acuity Right Eye Distance:   Left Eye Distance:   Bilateral Distance:    Right Eye Near:   Left Eye Near:    Bilateral Near:     Physical Exam Vitals reviewed.  Constitutional:      General: He is not in acute distress.    Appearance: Normal appearance. He is not ill-appearing, toxic-appearing or diaphoretic.  HENT:     Head:  Normocephalic and atraumatic.     Jaw: There is normal jaw occlusion. No trismus, tenderness, swelling, pain on movement or malocclusion.     Salivary Glands: Right salivary gland is not diffusely enlarged or tender. Left salivary gland is not diffusely enlarged or tender.     Right Ear: Hearing normal.     Left Ear: Hearing normal.     Nose: Nose normal.     Mouth/Throat:     Lips: Pink.     Mouth: Mucous membranes are moist. No lacerations or oral lesions.     Dentition: Abnormal dentition. Does not have dentures. Dental tenderness and dental caries present.     Tongue: No lesions. Tongue does not deviate from midline.     Palate: No mass.     Pharynx: Oropharynx is clear. Uvula midline. No oropharyngeal exudate or posterior oropharyngeal erythema.     Tonsils: No tonsillar exudate or tonsillar abscesses.     Comments: Poor dentician  There is swelling surrounding the L lower molars, with corresponding facial swelling. No trismus, drooling, sore throat, voice changes, swelling underneath the tongue,  swelling underneath the jaw, neck stiffness.  Eyes:     Extraocular Movements: Extraocular movements intact.     Pupils: Pupils are equal, round, and reactive to light.  Pulmonary:     Effort: Pulmonary effort is normal.  Neurological:     General: No focal deficit present.     Mental Status: He is alert and oriented to person, place, and time.  Psychiatric:        Mood and Affect: Mood normal.        Behavior: Behavior normal.        Thought Content: Thought content normal.        Judgment: Judgment normal.      UC Treatments / Results  Labs (all labs ordered are listed, but only abnormal results are displayed) Labs Reviewed - No data to display  EKG   Radiology No results found.  Procedures Procedures (including critical care time)  Medications Ordered in UC Medications - No data to display  Initial Impression / Assessment and Plan / UC Course  I have reviewed the triage vital signs and the nursing notes.  Pertinent labs & imaging results that were available during my care of the patient were reviewed by me and considered in my medical decision making (see chart for details).     This patient is a very pleasant 46 y.o. year old male presenting with dental infection. Afebrile, nontachy. Augmentin sent. He does have a dentist already, f/u with them 8/23 or sooner if possible. ED return precautions discussed. Patient verbalizes understanding and agreement. .   Patient is now requesting something for pain. I will send ibuprofen 800mg  to his pharmacy. He does not have a history of kidney ds.   Final Clinical Impressions(s) / UC Diagnoses   Final diagnoses:  Dental abscess     Discharge Instructions      -Start the antibiotic-Augmentin (amoxicillin-clavulanate), 1 pill every 12 hours for 7 days.  You can take this with food like with breakfast and dinner. -Follow-up with dentist as scheduled in 8/23 -Follow-up sooner if new symptoms like pain under the chin or  tongue, weird tastes in your mouth, new fevers, throat closing, etc    ED Prescriptions     Medication Sig Dispense Auth. Provider   amoxicillin-clavulanate (AUGMENTIN) 875-125 MG tablet Take 1 tablet by mouth every 12 (twelve) hours. 14 tablet 9/23,  PA-C      I have reviewed the PDMP during this encounter.   Rhys Martini, PA-C 08/16/21 0354    Rhys Martini, PA-C 08/16/21 1446

## 2021-08-16 NOTE — Telephone Encounter (Signed)
Ibuprofen sent at patient request.

## 2021-08-18 ENCOUNTER — Emergency Department (HOSPITAL_COMMUNITY): Admission: EM | Admit: 2021-08-18 | Discharge: 2021-08-18 | Discharge status: Against Medical Advice | Payer: Self-pay

## 2021-08-18 NOTE — ED Notes (Signed)
Patient did not respond to call for triage x 3

## 2021-10-04 ENCOUNTER — Ambulatory Visit
Admission: EM | Admit: 2021-10-04 | Discharge: 2021-10-04 | Disposition: A | Discharge status: Home / Self Care | Payer: PRIVATE HEALTH INSURANCE | Attending: Physician Assistant | Admitting: Physician Assistant

## 2021-10-04 ENCOUNTER — Encounter: Payer: Self-pay | Admitting: Emergency Medicine

## 2021-10-04 ENCOUNTER — Ambulatory Visit: Admission: EM | Admit: 2021-10-04 | Discharge: 2021-10-04 | Discharge status: Absent without leave | Payer: Self-pay

## 2021-10-04 DIAGNOSIS — Z202 Contact with and (suspected) exposure to infections with a predominantly sexual mode of transmission: Secondary | ICD-10-CM | POA: Diagnosis present

## 2021-10-04 DIAGNOSIS — R369 Urethral discharge, unspecified: Secondary | ICD-10-CM | POA: Insufficient documentation

## 2021-10-04 DIAGNOSIS — Z113 Encounter for screening for infections with a predominantly sexual mode of transmission: Secondary | ICD-10-CM | POA: Diagnosis present

## 2021-10-04 MED ORDER — METRONIDAZOLE 500 MG PO TABS: 500.0000 mg | ORAL_TABLET | Freq: Two times a day (BID) | ORAL | 0 refills | Status: AC

## 2021-10-04 NOTE — ED Provider Notes (Signed)
[facility]    CSN: [id number] Arrival date & time: 10/04/21  1143      History   Chief Complaint Chief Complaint  Patient presents with   Penile Discharge   Exposure to STD    HPI Cameron Carrillo is a 46 y.o. male.   Patient here today for evaluation of exposure to trichomonas.  He reports that he has had penile discharge for about 1 week.  He notes that his partner informed him that she tested positive for trichomonas last weekend.  He has not had any fever.  He denies any genital rash or lesions.  He denies any abdominal pain, nausea or vomiting.   Penile Discharge  Exposure to STD    History reviewed. No pertinent past medical history.  Patient Active Problem List   Diagnosis Date Noted   Stress 10/18/2020   Elevated blood pressure reading without diagnosis of hypertension 10/18/2020   Palpitations 10/18/2020   Tobacco abuse 10/18/2020   BMI 29.0-29.9,adult 10/18/2020    History reviewed. No pertinent surgical history.     Home Medications    Prior to Admission medications   Medication Sig Start Date End Date Taking? Authorizing Provider  metroNIDAZOLE (FLAGYL) 500 MG tablet Take 1 tablet (500 mg total) by mouth 2 (two) times daily. 10/04/21  Yes Tomi Bamberger, PA-C  amoxicillin-clavulanate (AUGMENTIN) 875-125 MG tablet Take 1 tablet by mouth every 12 (twelve) hours. 08/16/21   Rhys Martini, PA-C  atorvastatin (LIPITOR) 10 MG tablet Take 1 tablet (10 mg total) by mouth daily. 10/25/20   Mayers, Cari S, PA-C  ibuprofen (ADVIL) 800 MG tablet Take 1 tablet (800 mg total) by mouth 3 (three) times daily. 08/16/21   Rhys Martini, PA-C    Family History History reviewed. No pertinent family history.  Social History Social History   Tobacco Use   Smoking status: Every Day   Smokeless tobacco: Never  Substance Use Topics   Alcohol use: Yes    Comment: 1 drink q 2 months now was daily, 2 glasses of brandy   Drug use: No    Types:  Marijuana    Comment: stopped october of last year     Allergies   Bee venom   Review of Systems Review of Systems  Constitutional:  Negative for chills and fever.  Eyes:  Negative for discharge and redness.  Genitourinary:  Positive for penile discharge. Negative for genital sores.  Neurological:  Negative for numbness.     Physical Exam Triage Vital Signs ED Triage Vitals  Enc Vitals Group     BP 10/04/21 1326 130/86     Pulse Rate 10/04/21 1326 68     Resp 10/04/21 1326 18     Temp 10/04/21 1326 97.9 F (36.6 C)     Temp src --      SpO2 10/04/21 1326 97 %     Weight --      Height --      Head Circumference --      Peak Flow --      Pain Score 10/04/21 1325 0     Pain Loc --      Pain Edu? --      Excl. in GC? --    No data found.  Updated Vital Signs BP 130/86   Pulse 68   Temp 97.9 F (36.6 C)   Resp 18   SpO2 97%   Physical Exam Vitals and nursing note reviewed.  Constitutional:  General: He is not in acute distress.    Appearance: Normal appearance. He is not ill-appearing.  HENT:     Head: Normocephalic and atraumatic.  Eyes:     Conjunctiva/sclera: Conjunctivae normal.  Cardiovascular:     Rate and Rhythm: Normal rate.  Pulmonary:     Effort: Pulmonary effort is normal.  Neurological:     Mental Status: He is alert.  Psychiatric:        Mood and Affect: Mood normal.        Behavior: Behavior normal.        Thought Content: Thought content normal.      UC Treatments / Results  Labs (all labs ordered are listed, but only abnormal results are displayed) Labs Reviewed  CYTOLOGY, (ORAL, ANAL, URETHRAL) ANCILLARY ONLY    EKG   Radiology No results found.  Procedures Procedures (including critical care time)  Medications Ordered in UC Medications - No data to display  Initial Impression / Assessment and Plan / UC Course  I have reviewed the triage vital signs and the nursing notes.  Pertinent labs & imaging results  that were available during my care of the patient were reviewed by me and considered in my medical decision making (see chart for details).    Routine STD screening ordered and will treat to cover trichomonas exposure.  Recommended follow-up with any further concerns or persistent symptoms, otherwise will await results for further recommendation.  Final Clinical Impressions(s) / UC Diagnoses   Final diagnoses:  Exposure to trichomonas  Screening for STD (sexually transmitted disease)  Penile discharge   Discharge Instructions   None    ED Prescriptions     Medication Sig Dispense Auth. Provider   metroNIDAZOLE (FLAGYL) 500 MG tablet Take 1 tablet (500 mg total) by mouth 2 (two) times daily. 14 tablet Tomi Bamberger, PA-C      PDMP not reviewed this encounter.   Tomi Bamberger, PA-C 10/04/21 1928

## 2021-10-04 NOTE — ED Triage Notes (Signed)
Pt is present today with concerns for STD. Pt states that he started experiencing penile discharge one week ago

## 2021-10-05 LAB — CYTOLOGY, (ORAL, ANAL, URETHRAL) ANCILLARY ONLY
Chlamydia: NEGATIVE
Comment: NEGATIVE
Comment: NEGATIVE
Comment: NORMAL
Neisseria Gonorrhea: NEGATIVE
Trichomonas: POSITIVE — AB

## 2023-07-13 IMAGING — CR DG CHEST 2V
2 series · 2 of 2 positions shown · non-contrast
Comparison: None

CLINICAL DATA: Palpitations and irregular heart rate in a
45-year-old male.

EXAM:
CHEST - 2 VIEW

[w chest pa]
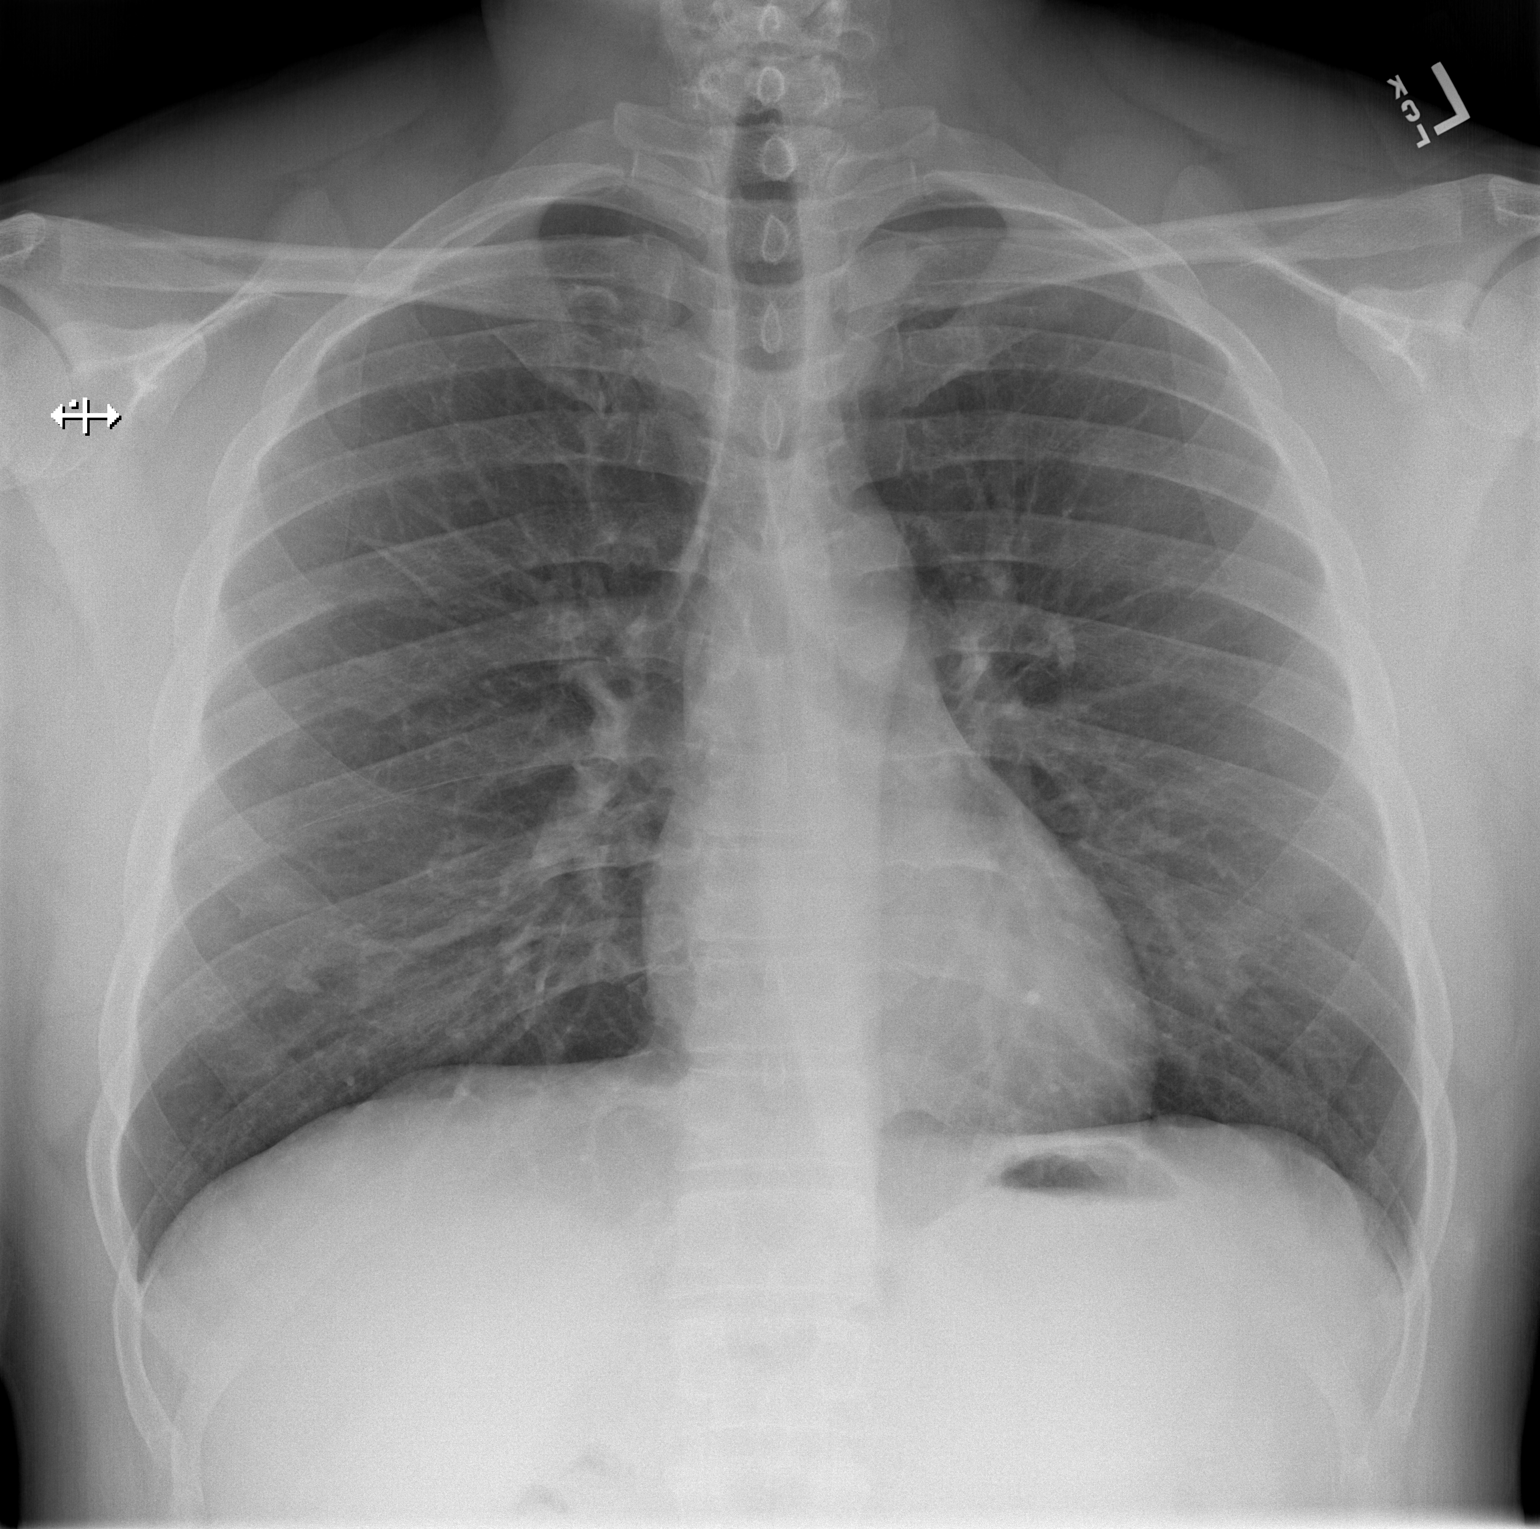

[w chest lat]
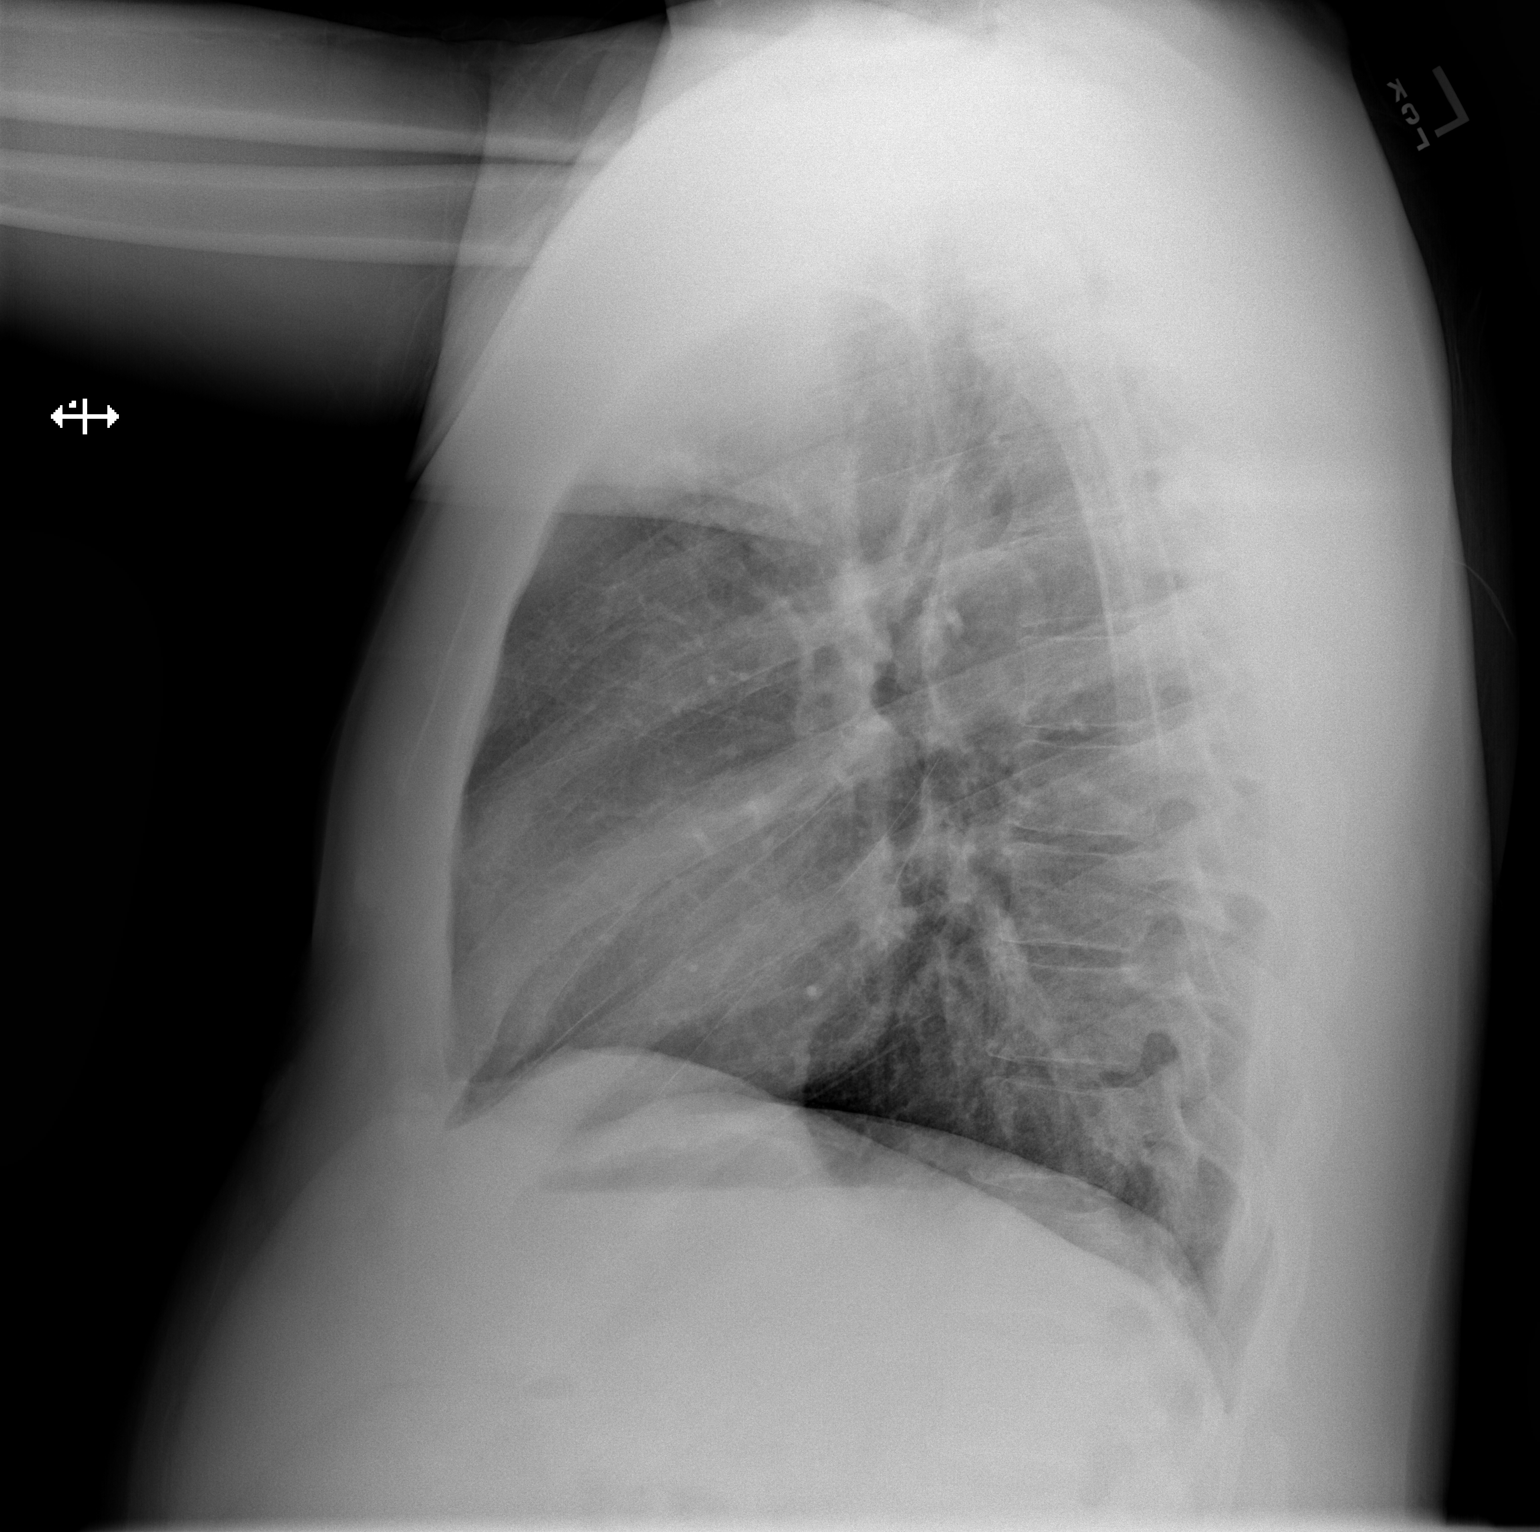

[2 of 2 positions shown; findings below may reference images not displayed]

FINDINGS: The heart size and mediastinal contours are within normal limits.
Both lungs are clear. The visualized skeletal structures are
unremarkable.
IMPRESSION: No active cardiopulmonary disease.
